# Patient Record
Sex: Female | Born: 1975
Health system: Southern US, Community
[De-identification: ages and names within clinical notes are randomized; demographics above are authoritative.]

## PROBLEM LIST (undated history)

## (undated) DIAGNOSIS — N201 Calculus of ureter: Secondary | ICD-10-CM

## (undated) DIAGNOSIS — R112 Nausea with vomiting, unspecified: Secondary | ICD-10-CM

## (undated) DIAGNOSIS — Z9889 Other specified postprocedural states: Secondary | ICD-10-CM

## (undated) DIAGNOSIS — G43909 Migraine, unspecified, not intractable, without status migrainosus: Secondary | ICD-10-CM

## (undated) DIAGNOSIS — N809 Endometriosis, unspecified: Secondary | ICD-10-CM

## (undated) DIAGNOSIS — R350 Frequency of micturition: Secondary | ICD-10-CM

## (undated) DIAGNOSIS — Z8619 Personal history of other infectious and parasitic diseases: Secondary | ICD-10-CM

## (undated) DIAGNOSIS — R3915 Urgency of urination: Secondary | ICD-10-CM

## (undated) DIAGNOSIS — R030 Elevated blood-pressure reading, without diagnosis of hypertension: Secondary | ICD-10-CM

## (undated) HISTORY — DX: Endometriosis, unspecified: N80.9

## (undated) HISTORY — DX: Migraine, unspecified, not intractable, without status migrainosus: G43.909

## (undated) HISTORY — DX: Personal history of other infectious and parasitic diseases: Z86.19

---
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
## 2003-12-03 ENCOUNTER — Other Ambulatory Visit: Admission: RE | Admit: 2003-12-03 | Discharge: 2003-12-03 | Payer: Self-pay | Admitting: Obstetrics & Gynecology

## 2004-07-14 ENCOUNTER — Inpatient Hospital Stay (HOSPITAL_COMMUNITY): Admission: AD | Admit: 2004-07-14 | Discharge: 2004-07-16 | Payer: Self-pay | Admitting: Obstetrics & Gynecology

## 2005-08-05 ENCOUNTER — Emergency Department (HOSPITAL_COMMUNITY): Admission: EM | Admit: 2005-08-05 | Discharge: 2005-08-05 | Payer: Self-pay | Admitting: Family Medicine

## 2007-06-08 ENCOUNTER — Ambulatory Visit (HOSPITAL_COMMUNITY): Admission: RE | Admit: 2007-06-08 | Discharge: 2007-06-08 | Payer: Self-pay | Admitting: Obstetrics & Gynecology

## 2007-10-02 ENCOUNTER — Ambulatory Visit (HOSPITAL_COMMUNITY): Admission: RE | Admit: 2007-10-02 | Discharge: 2007-10-02 | Payer: Self-pay | Admitting: Obstetrics and Gynecology

## 2007-10-02 HISTORY — PX: OTHER SURGICAL HISTORY: SHX169

## 2008-12-28 ENCOUNTER — Inpatient Hospital Stay (HOSPITAL_COMMUNITY): Admission: AD | Admit: 2008-12-28 | Discharge: 2008-12-30 | Payer: Self-pay | Admitting: Obstetrics and Gynecology

## 2010-08-30 ENCOUNTER — Encounter: Payer: Self-pay | Admitting: Obstetrics and Gynecology

## 2010-11-17 LAB — CBC
MCHC: 34.9 g/dL (ref 30.0–36.0)
MCV: 95 fL (ref 78.0–100.0)
Platelets: 167 10*3/uL (ref 150–400)
RBC: 3 MIL/uL — ABNORMAL LOW (ref 3.87–5.11)
RDW: 14.9 % (ref 11.5–15.5)
WBC: 15.2 10*3/uL — ABNORMAL HIGH (ref 4.0–10.5)

## 2010-12-22 NOTE — Op Note (Signed)
[name], [name]                 ACCOUNT NO.:  [id number]   MEDICAL RECORD NO.:  [id number]          PATIENT TYPE:  AMB   LOCATION:  SDC                           FACILITY:  [facility]   PHYSICIAN:  Fermin Schwab, MD   DATE OF BIRTH:  01/17/1976   DATE OF PROCEDURE:  10/02/2007  DATE OF DISCHARGE:                               OPERATIVE REPORT   PREOPERATIVE DIAGNOSIS:  Dysmenorrhea, probable endometriosis, probable  pelvic adhesions, probable right hydrosalpinx.   POSTOPERATIVE DIAGNOSIS:  Stage II endometriosis of the ovaries,  posterior cul-de-sac, uterus, bowel and diaphragm; right fimbrial  phimosis, pelvic adhesions.   PROCEDURE:  Laparoscopy, lysis of adhesions and paralysis, ablation and  biopsy of peritoneal endometriotic lesions, right fimbrioplasty.   SURGEON:  Dr. Fermin Schwab   ANESTHESIA:  General endotracheal.   FINDINGS:  On exam under anesthesia, external genitalia, Bartholin's,  Skene's and  urethra were normal.  The vagina appeared normal.  The  cervix was grossly normal.  The corpus was retroverted and normal size  and somewhat restricted mobility.  The left uterosacral ligament felt  thickened as if it contained a nodule.  No adnexal masses were palpable.  Rectovaginal exam confirmed sparing of the rectal mucosa.  On  laparoscopy, there was unexpected finding of 50 to 60 mL of old  thickened blood in the posterior cul-de-sac as well as in the pericolic  gutters and in the subdiaphragmatic space.  Considering the patient had  started her period 7 days prior to this, we concluded that this was a  combination of bleeding from pelvic peritoneal lesions as well as an  increased amount of retrograde menstruation.   There were hemosiderin pigmented areas with loosely attached small blood  clots, scattered on the right diaphragm surfaces more than on the left  hemidiaphragm surface.  The appendix and ileum were free of  endometriosis.  The sigmoid colon was  adherent to the left infundibular  pelvic ligament and left pelvic sidewall as well as to the left ovary  and contained some superficial polypoid endometriosis lesions.  The  posterior cul-de-sac had started to be obliterated with the advancement  of the uterus to left uterosacral ligament whereas the right part of the  cul-de-sac was free of adhesions.  These adhesions were dense.   The anterior cul-de-sac and the fundus of the uterus were normal.  The  posterior uterine serosa contained bleb-like lesions which may have been  endometriosis.  There were also dark adhesions which also likely  represent endometriosis on the fundus.  The left ovary was matted to the  left pelvic sidewall with cohesive adhesions.  There were also 2 to 3 cm  of superficial pink adhesions that likely represented endometriosis.  The left ovary could be freed up, to be adherent only 1/3 of its surface  area after the adhesiolysis.  The left tube was normal and contained 5/5  fimbria.  It was patent to chromotubation, except there was a filmy  adhesion between the infundibulum of the tube and the sigmoid colon.  The right tube was free of adhesions and  appeared normal except there  was some narrowing of the fimbria even though the fimbria appeared  healthy, and a diverticulum/distal dilation of the tube was noted upon  chromotubation.  This was taken care of by dilating the infundibulum of  the tube intraoperatively.   The right ovary contained superficial pink adhesions, 1 to 2 cm in  aggregate, likely representing endometriosis.   The posterior cul-de-sac was red with abnormal capillaries bleeding  diffusely.  This may have been the source of the old blood found in the  pelvis.  This likely represents a superficial form of endometriosis  involving the entire posterior cul-de-sac.   Added together, the lesions of endometriosis and the lesions that were  deemed to be endometriosis gave a point score of 4 + 4 +  4 + 8 + 1  equals 21 according to American Society for [employer]roductive Medicine  Revised Classification of Endometriosis.  This would make it a Stage III  endometriosis for this patient.   DESCRIPTION OF PROCEDURE:  The patient was placed in the lithotomy  position.  One gm of Kefzol was given intravenously for prophylaxis.  General endotracheal anesthesia was started.  The patient was placed in  the lithotomy position.  The abdomen, vagina, and peritoneum were  prepped with Betadine.  The patient was draped in a sterile manner. A  Foley catheter was inserted into the bladder.  A ZUMI catheter was  placed into the uterus after it sounded to 7.5 cm.  The balloon of the  ZUMI catheter was inflated to 3 mL with saline.  This was used for  chromotubation as well as used for manipulation during laparoscopy.  Next a surgeon was re-gloved and an operative field was created on the  abdomen.  A preemptive anesthesia was given 0.25% Marcaine with 1:200000  epinephrine into the infraumbilical region, as well as for the secondary  ports.  A 5 mm vertical incision was made in the umbilicus.  A Veress  needle was inserted.  A 5 mm trocar was inserted, and video laparoscopy  was started.  Two 5 mm lower abdominal incisions were also made for  ancillary ports.  Above findings were noted.  The old blood was  aspirated from subphrenic space as well as from the cul-de-sac.  A  careful search was done to look for other sources of this blood.  Except  for the raw-appearing in the posterior cul-de-sac, no other source was  found.  The superficial peritoneal lesions were too diffuse to be  ablated.  For this reason, only the raw area on the posterior cul-de-sac  peritoneum was ablated using a needle electrode in cutting mode at 35  watts setting.  The same procedure was done for the superficial  adhesions on both ovaries.  Some of the dark raised areas on the uterine  fundus were also ablated.  The rest of the  lesions may have been  endometriosis involving the entire posterior uterine serosa were left  alone.  A biopsy was taken from the dark raised adhesion on the right  utero-ovarian ligament and was sent to pathology.  Next, the left tube  was lysed from the intestinal adhesion.  With careful dissection, about  2/3 of the adhesions involving the left ovary could be lysed by blunt  and sharp dissection.  The sigmoid colon was separated from its adhesion  to the infundibular pelvic ligament and the left pelvic side wall.  The  beginning of obliteration of the posterior cul-de-sac  on the left was  then lysed by sharp dissection, and the uterus could now have free  mobility.  Next, chromotubation was performed and bilateral patency was  noted.  Because of ballooning of the right distal tube despite patency,  an atraumatic grasper was inserted gently into the right tubal  infundibulum and its jaws were spread to dilate the opening of the right  tube.  Hemostasis was assured.  The pelvis was copiously irrigated with  warm lactated ringers and aspirated.  A 20 mL slurry of 1 sheet of  Seprafilm in lactated ringer was instilled onto the left ovarian surface  and posterior uterus and cul-de-sac as an adhesion barrier.  The  instruments were removed again, and the gas was allowed to come out.  The instrument count was correct.  The skin incisions were approximated  with Dermabond.  The estimated blood loss was less than 50 mL.  The  patient tolerated the procedure well and was transferred to the recovery  room in satisfactory condition.      Fermin Schwab, MD  Electronically Signed    TY/MEDQ  D:  10/03/2007  T:  10/03/2007  Job:  [id number]   cc:   Ilda Mori, M.D.  Fax: [phone]

## 2011-04-30 LAB — CBC
Hemoglobin: 13.4
MCV: 88.9
Platelets: 261
RBC: 4.32
RDW: 13.4
WBC: 6.2

## 2012-09-25 ENCOUNTER — Other Ambulatory Visit: Payer: Self-pay | Admitting: Surgery

## 2013-01-29 ENCOUNTER — Other Ambulatory Visit: Payer: Self-pay | Admitting: Obstetrics and Gynecology

## 2013-05-21 ENCOUNTER — Encounter (HOSPITAL_COMMUNITY): Payer: Self-pay | Admitting: Emergency Medicine

## 2013-05-21 ENCOUNTER — Emergency Department (HOSPITAL_COMMUNITY)
Admission: EM | Admit: 2013-05-21 | Discharge: 2013-05-21 | Disposition: A | Discharge status: Home / Self Care | Payer: BC Managed Care – PPO | Source: Home / Self Care | Attending: Emergency Medicine | Admitting: Emergency Medicine

## 2013-05-21 DIAGNOSIS — B349 Viral infection, unspecified: Secondary | ICD-10-CM

## 2013-05-21 DIAGNOSIS — B9789 Other viral agents as the cause of diseases classified elsewhere: Secondary | ICD-10-CM

## 2013-05-21 MED ORDER — ZOLMITRIPTAN 2.5 MG PO TABS: 2.5000 mg | ORAL_TABLET | ORAL | Status: DC | PRN

## 2013-05-21 MED ORDER — DICLOFENAC POTASSIUM 25 MG PO CAPS: ORAL_CAPSULE | ORAL | Status: DC

## 2013-05-21 NOTE — ED Provider Notes (Signed)
Chief Complaint:   Chief Complaint  Patient presents with  . Fever    History of Present Illness:   [name] is a-year-old female who has had a six-day history of low-grade fever of up to 100, chills, and myalgias. Her temperature is back down to normal and the myalgias have gone away, but she still has chills at nighttime. This has caused her migraine headaches to flareup. She's had these for years and they usually respond well to Zomig and diclofenac. The symptoms came on 2 days after a trip to [place] She has no known exposures there, but thinks she may have been bitten by mosquitoes there. She denies any nasal congestion, rhinorrhea, earache, sore throat, swollen glands, stiff neck, cough, abdominal pain, nausea, vomiting, diarrhea, urinary or GYN complaints.  Review of Systems:  Other than noted above, the patient denies any of the following symptoms. Systemic:  No chills, sweats, fatigue, myalgias, headache, or anorexia. Eye:  No redness, pain or drainage. ENT:  No earache, nasal congestion, rhinorrhea, sinus pressure, or sore throat. No adenopathy or stiff neck. Lungs:  No cough, sputum production, wheezing, shortness of breath.  Cardiovascular:  No chest pain, palpitations, or syncope. GI:  No nausea, vomiting, abdominal pain or diarrhea. GU:  No dysuria, frequency, or hematuria. Skin:  No rash or pruritis.  PMFSH:  Past medical history, family history, social history, meds, and allergies were reviewed. There is no history of recent foreign travel, animal exposure, suspicious ingestions or tick bite.  No new medications, vaccination, or stings.    Physical Exam:   Vital signs:  BP 149/98  Pulse 113  Temp(Src) 99.1 F (37.3 C) (Oral)  Resp 16  SpO2 99%  LMP 05/11/2013 General:  Alert, in no distress. Eye:  PERRL, full EOMs.  Lids and conjunctivas were normal. ENT:  TMs and canals were normal, without erythema or inflammation.  Nasal mucosa was clear and uncongested,  without drainage.  Mucous membranes were moist.  Pharynx was erythematous  without exudate or drainage.  There were no oral ulcerations or lesions. Neck:  Supple, no adenopathy, tenderness or mass. Thyroid was normal. Lungs:  No respiratory distress.  Lungs were clear to auscultation, without wheezes, rales or rhonchi.  Breath sounds were clear and equal bilaterally. Heart:  Regular rhythm, without gallops, murmers or rubs. Abdomen:  Soft, flat, and non-tender to palpation.  No hepatosplenomagaly or mass. Extremities:  No swelling, erythema, or joint pain to palpation. Skin:  Clear, warm, and dry, without rash or lesions.  Assessment:  The encounter diagnosis was Viral syndrome.  Differential diagnosis includes viral URI, influenza, or dengue fever.  Plan:   1.  Meds:  The following meds were prescribed:   Discharge Medication List as of 05/21/2013  9:25 AM    START taking these medications   Details  !! Diclofenac Potassium (ZIPSOR) 25 MG CAPS Take 1 every 8 hours as needed for migraine headache., Normal    !! ZOLMitriptan (ZOMIG) 2.5 MG tablet Take 1 tablet (2.5 mg total) by mouth as needed for migraine., Starting 05/21/2013, Until Discontinued, Normal     !! - Potential duplicate medications found. Please discuss with provider.      2.  Patient Education/Counseling:  The patient was given appropriate handouts, self care instructions, and instructed in symptomatic relief.  Suggested rest and fluids. May return to work on Wednesday.  3.  Follow up:  The patient was told to follow up if no better in 3 to  4 days, if becoming worse in any way, and given some red flag symptoms such as increasing fever or if she should become worse in any way which would prompt immediate return.  Follow up here as needed.     Reuben Likes, MD 05/21/13 1007

## 2013-05-21 NOTE — ED Notes (Addendum)
Pt  Reports    About  5  Days  Ago  She  Developed   Body   Aches     Low  Grade  Fever    Pt  Reports       Recently  Returned  From    [place]         She  denys  Any  sorethroat       Or  Any  Diarrhea

## 2014-02-18 ENCOUNTER — Other Ambulatory Visit: Payer: Self-pay | Admitting: Obstetrics and Gynecology

## 2014-02-18 LAB — HM PAP SMEAR: HM Pap smear: NORMAL

## 2014-02-19 LAB — CYTOLOGY - PAP

## 2014-06-28 DIAGNOSIS — J3489 Other specified disorders of nose and nasal sinuses: Secondary | ICD-10-CM | POA: Insufficient documentation

## 2014-09-09 HISTORY — PX: NASAL SEPTUM SURGERY: SHX37

## 2014-11-20 ENCOUNTER — Encounter: Payer: Self-pay | Admitting: Internal Medicine

## 2014-11-20 ENCOUNTER — Ambulatory Visit (INDEPENDENT_AMBULATORY_CARE_PROVIDER_SITE_OTHER): Payer: BLUE CROSS/BLUE SHIELD | Admitting: Internal Medicine

## 2014-11-20 VITALS — BP 135/86 | Temp 99.2°F | Ht 62.75 in | Wt 127.6 lb

## 2014-11-20 DIAGNOSIS — R519 Headache, unspecified: Secondary | ICD-10-CM

## 2014-11-20 DIAGNOSIS — Z23 Encounter for immunization: Secondary | ICD-10-CM

## 2014-11-20 DIAGNOSIS — R51 Headache: Secondary | ICD-10-CM

## 2014-11-20 DIAGNOSIS — R030 Elevated blood-pressure reading, without diagnosis of hypertension: Secondary | ICD-10-CM

## 2014-11-20 DIAGNOSIS — Z7189 Other specified counseling: Secondary | ICD-10-CM | POA: Diagnosis not present

## 2014-11-20 DIAGNOSIS — Z7689 Persons encountering health services in other specified circumstances: Secondary | ICD-10-CM

## 2014-11-20 DIAGNOSIS — IMO0001 Reserved for inherently not codable concepts without codable children: Secondary | ICD-10-CM

## 2014-11-20 NOTE — Patient Instructions (Signed)
Take blood pressure readings twice a day for 10 - 14 days and then periodically .To ensure below 140/90   .record   Return in a month   With monitor and  Readings    .  Get us copy of labs from [facility]  and derm to review . We will then add what is appropriate .    DASH Eating Plan DASH stands for "Dietary Approaches to Stop Hypertension." The DASH eating plan is a healthy eating plan that has been shown to reduce high blood pressure (hypertension). Additional health benefits may include reducing the risk of type 2 diabetes mellitus, heart disease, and stroke. The DASH eating plan may also help with weight loss. WHAT DO I NEED TO KNOW ABOUT THE DASH EATING PLAN? For the DASH eating plan, you will follow these general guidelines:  Choose foods with a percent daily value for sodium of less than 5% (as listed on the food label).  Use salt-free seasonings or herbs instead of table salt or sea salt.  Check with your health care provider or pharmacist before using salt substitutes.  Eat lower-sodium products, often labeled as "lower sodium" or "no salt added."  Eat fresh foods.  Eat more vegetables, fruits, and low-fat dairy products.  Choose whole grains. Look for the word "whole" as the first word in the ingredient list.  Choose fish and skinless chicken or [place] more often than red meat. Limit fish, poultry, and meat to 6 oz (170 g) each day.  Limit sweets, desserts, sugars, and sugary drinks.  Choose heart-healthy fats.  Limit cheese to 1 oz (28 g) per day.  Eat more home-cooked food and less restaurant, buffet, and fast food.  Limit fried foods.  Cook foods using methods other than frying.  Limit canned vegetables. If you do use them, rinse them well to decrease the sodium.  When eating at a restaurant, ask that your food be prepared with less salt, or no salt if possible. WHAT FOODS CAN I EAT? Seek help from a dietitian for individual calorie needs. Grains Whole grain or  whole wheat bread. Brown rice. Whole grain or whole wheat pasta. Quinoa, bulgur, and whole grain cereals. Low-sodium cereals. Corn or whole wheat flour tortillas. Whole grain cornbread. Whole grain crackers. Low-sodium crackers. Vegetables Fresh or frozen vegetables (raw, steamed, roasted, or grilled). Low-sodium or reduced-sodium tomato and vegetable juices. Low-sodium or reduced-sodium tomato sauce and paste. Low-sodium or reduced-sodium canned vegetables.  Fruits All fresh, canned (in natural juice), or frozen fruits. Meat and Other Protein Products Ground beef (85% or leaner), grass-fed beef, or beef trimmed of fat. Skinless chicken or [place]. Ground chicken or [place]. Pork trimmed of fat. All fish and seafood. Eggs. Dried beans, peas, or lentils. Unsalted nuts and seeds. Unsalted canned beans. Dairy Low-fat dairy products, such as skim or 1% milk, 2% or reduced-fat cheeses, low-fat ricotta or cottage cheese, or plain low-fat yogurt. Low-sodium or reduced-sodium cheeses. Fats and Oils Tub margarines without trans fats. Light or reduced-fat mayonnaise and salad dressings (reduced sodium). Avocado. Safflower, olive, or canola oils. Natural peanut or almond butter. Other Unsalted popcorn and pretzels. The items listed above may not be a complete list of recommended foods or beverages. Contact your dietitian for more options. WHAT FOODS ARE NOT RECOMMENDED? Grains White bread. White pasta. White rice. Refined cornbread. Bagels and croissants. Crackers that contain trans fat. Vegetables Creamed or fried vegetables. Vegetables in a cheese sauce. Regular canned vegetables. Regular canned tomato sauce and paste. Regular  tomato and vegetable juices. Fruits Dried fruits. Canned fruit in light or heavy syrup. Fruit juice. Meat and Other Protein Products Fatty cuts of meat. Ribs, chicken wings, bacon, sausage, bologna, salami, chitterlings, fatback, hot dogs, bratwurst, and packaged luncheon meats.  Salted nuts and seeds. Canned beans with salt. Dairy Whole or 2% milk, cream, half-and-half, and cream cheese. Whole-fat or sweetened yogurt. Full-fat cheeses or blue cheese. Nondairy creamers and whipped toppings. Processed cheese, cheese spreads, or cheese curds. Condiments Onion and garlic salt, seasoned salt, table salt, and sea salt. Canned and packaged gravies. Worcestershire sauce. Tartar sauce. Barbecue sauce. Teriyaki sauce. Soy sauce, including reduced sodium. Steak sauce. Fish sauce. Oyster sauce. Cocktail sauce. Horseradish. Ketchup and mustard. Meat flavorings and tenderizers. Bouillon cubes. Hot sauce. Tabasco sauce. Marinades. Taco seasonings. Relishes. Fats and Oils Butter, stick margarine, lard, shortening, ghee, and bacon fat. Coconut, palm kernel, or palm oils. Regular salad dressings. Other Pickles and olives. Salted popcorn and pretzels. The items listed above may not be a complete list of foods and beverages to avoid. Contact your dietitian for more information. WHERE CAN I FIND MORE INFORMATION? National Heart, Lung, and Blood Institute: [employer] Document Released: 07/15/2011 Document Revised: 12/10/2013 Document Reviewed: 05/30/2013 [facility] Patient Information 2015 [place], [place]. This information is not intended to replace advice given to you by your health care provider. Make sure you discuss any questions you have with your health care provider.

## 2014-11-20 NOTE — Progress Notes (Signed)
Pre visit review using our clinic review tool, if applicable. No additional management support is needed unless otherwise documented below in the visit note.  Chief Complaint  Patient presents with  . Establish Care    HPI: Patient  [name]  39 y.o. comes in today for New patient / Health Care visit  who is a married  Mother of 2 college graduate say at home with kids orig from [place]  .  She hasnt really had a PCP bu gyne dr Tenny Craw and has seen derm for  Hair thinning and in past mole removals  And mini clinic for other issues . She has had noted elevated bp readings with out dx of hypertension sometimes normal  High BP  readings. : Deviated septum had elevated bp when having anesthesia.  140/90  No meds for this  Apparently nasal issues  In retrospect caused sig pm sleep issues and headache aggravation  As she feels much better since the surgery   Monitor BP checking can causes some anxiety .   Even with monitor . At home  And raise readings  Parents  On meds for ht. No ht of pregnancy .  Migraines  2 years ago .  Nasal surgery helped the  Headaches . Frequency .  Periods : iud for 6 years  Migraine med .    [facility] Dr.  [facility]     Health Maintenance  Topic Date Due  . HIV Screening  11/10/2015 (Originally 10/17/1990)  . INFLUENZA VACCINE  03/10/2015  . PAP SMEAR  02/18/2017  . TETANUS/TDAP  11/19/2024   Health Maintenance Review LIFESTYLE:  Exercise:   Walking and yoga  No limitations  Tobacco/ETS:no Alcohol: per day social Sugar beverages:no Sleep: better after nose surgery  Drug use: no  PAP:utd  ROS:  GEN/ HEENT: No fever, significant weight changes sweats vision problems hearing changes, CV/ PULM; No chest pain shortness of breath cough, syncope,edema  change in exercise tolerance. GI /GU: No adominal pain, vomiting, change in bowel habits. No blood in the stool. No significant GU symptoms. SKIN/HEME: ,no acute skin rashes suspicious lesions or  bleeding. No lymphadenopathy, nodules, masses.  NEURO/ PSYCH:  No neurologic signs such as weakness numbness. No depression anxiety. IMM/ Allergy: No unusual infections.  Allergy .   REST of 12 system review negative except as per HPI   Past Medical History  Diagnosis Date  . Migraine   . Hx of varicella   . Endometriosis     surgery 2009    Past Surgical History  Procedure Laterality Date  . Tonsillectomy  85  . Nasal septum surgery  feb 16     [facility] Dr Berna Spare  . Ablation on endometriosis  2009    Family History  Problem Relation Age of Onset  . Hypertension Mother   . Hypertension Father   . Breast cancer Mother     age 29   . Heart failure Mother     after breast cancer rx has icdalso     History   Social History  . Marital Status: Married    Spouse Name: N/A  . Number of Children: N/A  . Years of Education: N/A   Social History Main Topics  . Smoking status: Never Smoker   . Smokeless tobacco: Never Used  . Alcohol Use: 0.0 oz/week    0 Standard drinks or equivalent per week  . Drug Use: Not on file  . Sexual Activity:  Partners: Male   Other Topics Concern  . None   Social History Narrative   6-8 hours of sleep per night   Does not work outside home   Married for 12 years with 2 children   Children are 2yrs and 5 yrs.   No pets   etoh social    Hot yoga  And walking    g2 p2   Born raised  [place]   College degree [employer] [place]    Outpatient Encounter Prescriptions as of 11/20/2014  Medication Sig  . Biotin 5000 MCG CAPS Take by mouth.  . Diclofenac Potassium (ZIPSOR) 25 MG CAPS Take 1 every 8 hours as needed for migraine headache.  . IRON PO Take by mouth.  . levonorgestrel (MIRENA) 20 MCG/24HR IUD by Intrauterine route.  . Omega-3 Fatty Acids (FISH OIL) 1200 MG CAPS Take by mouth.  Marland Kitchen ZOLMitriptan (ZOMIG) 2.5 MG tablet Take 1 tablet (2.5 mg total) by mouth as needed for migraine.  . [DISCONTINUED] Diclofenac  Potassium (ZIPSOR PO) Take by mouth.  . [DISCONTINUED] ZOLMitriptan (ZOMIG PO) Take by mouth.    EXAM:  BP 135/86 mmHg  Temp(Src) 99.2 F (37.3 C) (Oral)  Ht 5' 2.75" (1.594 m)  Wt 127 lb 9.6 oz (57.879 kg)  BMI 22.78 kg/m2  Body mass index is 22.78 kg/(m^2). Repeat 135/86 right 130/90 let  Physical Exam: Vital signs reviewed ZOX:WRUE is a well-developed well-nourished alert cooperative    who appearsr stated age in no acute distress.  HEENT: normocephalic atraumatic , Eyes: PERRL EOM's full, conjunctiva clear, Nares: paten,t no deformity discharge or tenderness., Ears: no deformity EAC's clear TMs with normal landmarks. Mouth: clear OP, no lesions, edema.  Moist mucous membranes. Dentition in adequate repair. NECK: supple without masses, thyromegaly or bruits. CHEST/PULM:  Clear to auscultation breath sounds equal no wheeze , rales or rhonchi. No chest wall deformities or tenderness. CV: PMI is nondisplaced, S1 S2 no gallops, murmurs, rubs. Peripheral pulses are full without delay. ABDOMEN: Bowel sounds normal nontender  No guard or rebound, no hepato splenomegal no CVA tenderness.  . Extremtities:  No clubbing cyanosis or edema, no acute joint swelling or redness no focal atrophy NEURO:  Oriented x3, cranial nerves 3-12 appear to be intact, no obvious focal weakness,gait within normal limits no abnormal reflexes or asymmetrical SKIN: No acute rashes normal turgor, color, no bruising or petechiae. PSYCH: Oriented, good eye contact, no obvious depression anxiety, cognition and judgment appear normal. LN: no cervical l adenopathy  Lab Results  Component Value Date   WBC 15.2* 12/29/2008   HGB 9.9 DELTA CHECK NOTED* 12/29/2008   HCT 28.4* 12/29/2008   PLT 150 12/29/2008    ASSESSMENT AND PLAN:  Discussed the following assessment and plan:  Elevated BP - poss ht fam hx white oat effect disc dx lsi and meds if needed may be getting better after nose surgery sleeps  better  Recurrent headache - migraine  better since had nasal surgery   Encounter to establish care with new doctor  Need for Tdap vaccination - Plan: Tdap vaccine greater than or equal to 7yo IM Plan on labs  Depending on results and tests done by her gyne and derm recenetly  Patient Care Team: Madelin Headings, MD as PCP - General (Internal Medicine) Patient Instructions  Take blood pressure readings twice a day for 10 - 14 days and then periodically .To ensure below 140/90   .record   Return in a month  With monitor and  Readings    .  Get [place] copy of labs from [facility]  and derm to review . We will then add what is appropriate .    DASH Eating Plan DASH stands for "Dietary Approaches to Stop Hypertension." The DASH eating plan is a healthy eating plan that has been shown to reduce high blood pressure (hypertension). Additional health benefits may include reducing the risk of type 2 diabetes mellitus, heart disease, and stroke. The DASH eating plan may also help with weight loss. WHAT DO I NEED TO KNOW ABOUT THE DASH EATING PLAN? For the DASH eating plan, you will follow these general guidelines:  Choose foods with a percent daily value for sodium of less than 5% (as listed on the food label).  Use salt-free seasonings or herbs instead of table salt or sea salt.  Check with your health care provider or pharmacist before using salt substitutes.  Eat lower-sodium products, often labeled as "lower sodium" or "no salt added."  Eat fresh foods.  Eat more vegetables, fruits, and low-fat dairy products.  Choose whole grains. Look for the word "whole" as the first word in the ingredient list.  Choose fish and skinless chicken or [place] more often than red meat. Limit fish, poultry, and meat to 6 oz (170 g) each day.  Limit sweets, desserts, sugars, and sugary drinks.  Choose heart-healthy fats.  Limit cheese to 1 oz (28 g) per day.  Eat more home-cooked food and less restaurant,  buffet, and fast food.  Limit fried foods.  Cook foods using methods other than frying.  Limit canned vegetables. If you do use them, rinse them well to decrease the sodium.  When eating at a restaurant, ask that your food be prepared with less salt, or no salt if possible. WHAT FOODS CAN I EAT? Seek help from a dietitian for individual calorie needs. Grains Whole grain or whole wheat bread. Brown rice. Whole grain or whole wheat pasta. Quinoa, bulgur, and whole grain cereals. Low-sodium cereals. Corn or whole wheat flour tortillas. Whole grain cornbread. Whole grain crackers. Low-sodium crackers. Vegetables Fresh or frozen vegetables (raw, steamed, roasted, or grilled). Low-sodium or reduced-sodium tomato and vegetable juices. Low-sodium or reduced-sodium tomato sauce and paste. Low-sodium or reduced-sodium canned vegetables.  Fruits All fresh, canned (in natural juice), or frozen fruits. Meat and Other Protein Products Ground beef (85% or leaner), grass-fed beef, or beef trimmed of fat. Skinless chicken or [place]. Ground chicken or [place]. Pork trimmed of fat. All fish and seafood. Eggs. Dried beans, peas, or lentils. Unsalted nuts and seeds. Unsalted canned beans. Dairy Low-fat dairy products, such as skim or 1% milk, 2% or reduced-fat cheeses, low-fat ricotta or cottage cheese, or plain low-fat yogurt. Low-sodium or reduced-sodium cheeses. Fats and Oils Tub margarines without trans fats. Light or reduced-fat mayonnaise and salad dressings (reduced sodium). Avocado. Safflower, olive, or canola oils. Natural peanut or almond butter. Other Unsalted popcorn and pretzels. The items listed above may not be a complete list of recommended foods or beverages. Contact your dietitian for more options. WHAT FOODS ARE NOT RECOMMENDED? Grains White bread. White pasta. White rice. Refined cornbread. Bagels and croissants. Crackers that contain trans fat. Vegetables Creamed or fried vegetables.  Vegetables in a cheese sauce. Regular canned vegetables. Regular canned tomato sauce and paste. Regular tomato and vegetable juices. Fruits Dried fruits. Canned fruit in light or heavy syrup. Fruit juice. Meat and Other Protein Products Fatty cuts of meat. Ribs, chicken wings, bacon,  sausage, bologna, salami, chitterlings, fatback, hot dogs, bratwurst, and packaged luncheon meats. Salted nuts and seeds. Canned beans with salt. Dairy Whole or 2% milk, cream, half-and-half, and cream cheese. Whole-fat or sweetened yogurt. Full-fat cheeses or blue cheese. Nondairy creamers and whipped toppings. Processed cheese, cheese spreads, or cheese curds. Condiments Onion and garlic salt, seasoned salt, table salt, and sea salt. Canned and packaged gravies. Worcestershire sauce. Tartar sauce. Barbecue sauce. Teriyaki sauce. Soy sauce, including reduced sodium. Steak sauce. Fish sauce. Oyster sauce. Cocktail sauce. Horseradish. Ketchup and mustard. Meat flavorings and tenderizers. Bouillon cubes. Hot sauce. Tabasco sauce. Marinades. Taco seasonings. Relishes. Fats and Oils Butter, stick margarine, lard, shortening, ghee, and bacon fat. Coconut, palm kernel, or palm oils. Regular salad dressings. Other Pickles and olives. Salted popcorn and pretzels. The items listed above may not be a complete list of foods and beverages to avoid. Contact your dietitian for more information. WHERE CAN I FIND MORE INFORMATION? National Heart, Lung, and Blood Institute: CablePromo.itwww.nhlbi.nih.gov/health/health-topics/topics/dash/ Document Released: 07/15/2011 Document Revised: 12/10/2013 Document Reviewed: 05/30/2013 [facility] Patient Information 2015 [place]ExitCare, MarylandLLC. This information is not intended to replace advice given to you by your health care provider. Make sure you discuss any questions you have with your health care provider.      Neta MendsWanda K. Kassidy Dockendorf M.D.   BP Readings from Last 3 Encounters:  11/20/14 135/86  05/21/13 149/98

## 2014-11-24 ENCOUNTER — Encounter: Payer: Self-pay | Admitting: Internal Medicine

## 2014-11-24 DIAGNOSIS — IMO0001 Reserved for inherently not codable concepts without codable children: Secondary | ICD-10-CM | POA: Insufficient documentation

## 2014-11-24 DIAGNOSIS — R519 Headache, unspecified: Secondary | ICD-10-CM | POA: Insufficient documentation

## 2014-11-24 DIAGNOSIS — R030 Elevated blood-pressure reading, without diagnosis of hypertension: Principal | ICD-10-CM

## 2014-11-24 DIAGNOSIS — R51 Headache: Secondary | ICD-10-CM

## 2014-12-20 ENCOUNTER — Ambulatory Visit: Payer: BLUE CROSS/BLUE SHIELD | Admitting: Internal Medicine

## 2015-01-09 ENCOUNTER — Encounter: Payer: BLUE CROSS/BLUE SHIELD | Admitting: Internal Medicine

## 2015-01-09 NOTE — Progress Notes (Signed)
Document opened and reviewed for OV but appt  canceled same day .  

## 2015-01-30 ENCOUNTER — Ambulatory Visit (INDEPENDENT_AMBULATORY_CARE_PROVIDER_SITE_OTHER): Payer: BLUE CROSS/BLUE SHIELD | Admitting: Internal Medicine

## 2015-01-30 ENCOUNTER — Encounter: Payer: Self-pay | Admitting: Internal Medicine

## 2015-01-30 VITALS — BP 126/84 | Temp 98.5°F | Ht 62.75 in | Wt 123.0 lb

## 2015-01-30 DIAGNOSIS — IMO0001 Reserved for inherently not codable concepts without codable children: Secondary | ICD-10-CM

## 2015-01-30 DIAGNOSIS — R51 Headache: Secondary | ICD-10-CM | POA: Diagnosis not present

## 2015-01-30 DIAGNOSIS — R030 Elevated blood-pressure reading, without diagnosis of hypertension: Secondary | ICD-10-CM

## 2015-01-30 DIAGNOSIS — R519 Headache, unspecified: Secondary | ICD-10-CM

## 2015-01-30 NOTE — Progress Notes (Signed)
Pre visit review using our clinic review tool, if applicable. No additional management support is needed unless otherwise documented below in the visit note.  Chief Complaint  Patient presents with  . Follow-up    HPI: 43 T Ayub 39 y.o. comesinf for fu elevaetd bp readings  Canceled last  2 appt once cause of HA    Since then ok jsut bought a monitor  .  Had visit with ps for botox and reasign slightly elevated. Migraine less frequent trigger wine or caffiene. Doing better.  ROS: See pertinent positives and negatives per HPI.  Past Medical History  Diagnosis Date  . Migraine   . Hx of varicella   . Endometriosis     surgery 2009    Family History  Problem Relation Age of Onset  . Hypertension Mother   . Hypertension Father   . Breast cancer Mother     age 21   . Heart failure Mother     after breast cancer rx has icdalso     History   Social History  . Marital Status: Married    Spouse Name: N/A  . Number of Children: N/A  . Years of Education: N/A   Social History Main Topics  . Smoking status: Never Smoker   . Smokeless tobacco: Never Used  . Alcohol Use: 0.0 oz/week    0 Standard drinks or equivalent per week  . Drug Use: Not on file  . Sexual Activity:    Partners: Male   Other Topics Concern  . None   Social History Narrative   6-8 hours of sleep per night   Does not work outside home   Married for 12 years with 2 children   Children are 63yrs and 5 yrs.   No pets   etoh social    Hot yoga  And walking    g2 p2   Born raised  [place]   College degree [employer] [place]    Outpatient Prescriptions Prior to Visit  Medication Sig Dispense Refill  . Biotin 5000 MCG CAPS Take by mouth.    . Diclofenac Potassium (ZIPSOR) 25 MG CAPS Take 1 every 8 hours as needed for migraine headache. 30 capsule 3  . IRON PO Take by mouth.    . levonorgestrel (MIRENA) 20 MCG/24HR IUD by Intrauterine route.    . Omega-3 Fatty Acids  (FISH OIL) 1200 MG CAPS Take by mouth.    Marland Kitchen ZOLMitriptan (ZOMIG) 2.5 MG tablet Take 1 tablet (2.5 mg total) by mouth as needed for migraine. 10 tablet 3   No facility-administered medications prior to visit.     EXAM:  BP 126/84 mmHg  Temp(Src) 98.5 F (36.9 C) (Oral)  Ht 5' 2.75" (1.594 m)  Wt 123 lb (55.792 kg)  BMI 21.96 kg/m2  Body mass index is 21.96 kg/(m^2). GENERAL: vitals reviewed and listed above, alert, oriented, appears well hydrated and in no acute distress HEENT: aPSYCH: pleasant and cooperative, no obvious depression or anxiety BP Readings from Last 3 Encounters:  01/30/15 126/84  11/20/14 135/86  05/21/13 149/98    Machine 130/93  Office cuff  Later 126/84  Left arm .   ASSESSMENT AND PLAN:  Discussed the following assessment and plan:  Elevated BP - better  disc monitoring and sign up for my chart   Recurrent headache - better  follow   -Patient advised to return or notify health care team  if symptoms worsen ,persist or new concerns arise.  Patient Instructions  Your blood pressure monitor is good enough  Check readings at least 3 days per month  In a row as discussed  If  At goal then continue healthy life styl  Send in readings my chart  With confirmation and concerns.  Track headaches  As you are doing.   Wellness visit  Age 69 with labs .     Neta Mends. Bayler Gehrig M.D.

## 2015-01-30 NOTE — Patient Instructions (Signed)
Your blood pressure monitor is good enough  Check readings at least 3 days per month  In a row as discussed  If  At goal then continue healthy life styl  Send in readings my chart  With confirmation and concerns.  Track headaches  As you are doing.   Wellness visit  Age 39 with labs .

## 2015-02-11 ENCOUNTER — Emergency Department (HOSPITAL_COMMUNITY)
Admission: EM | Admit: 2015-02-11 | Discharge: 2015-02-12 | Disposition: A | Discharge status: Home / Self Care | Payer: BLUE CROSS/BLUE SHIELD | Attending: Emergency Medicine | Admitting: Emergency Medicine

## 2015-02-11 ENCOUNTER — Encounter (HOSPITAL_COMMUNITY): Payer: Self-pay | Admitting: Emergency Medicine

## 2015-02-11 DIAGNOSIS — N938 Other specified abnormal uterine and vaginal bleeding: Secondary | ICD-10-CM | POA: Diagnosis not present

## 2015-02-11 DIAGNOSIS — N201 Calculus of ureter: Secondary | ICD-10-CM | POA: Diagnosis not present

## 2015-02-11 DIAGNOSIS — N23 Unspecified renal colic: Secondary | ICD-10-CM | POA: Diagnosis not present

## 2015-02-11 DIAGNOSIS — R102 Pelvic and perineal pain: Secondary | ICD-10-CM

## 2015-02-11 DIAGNOSIS — Z3202 Encounter for pregnancy test, result negative: Secondary | ICD-10-CM | POA: Insufficient documentation

## 2015-02-11 DIAGNOSIS — G43909 Migraine, unspecified, not intractable, without status migrainosus: Secondary | ICD-10-CM | POA: Diagnosis not present

## 2015-02-11 DIAGNOSIS — N8320 Unspecified ovarian cysts: Secondary | ICD-10-CM | POA: Insufficient documentation

## 2015-02-11 DIAGNOSIS — Z8742 Personal history of other diseases of the female genital tract: Secondary | ICD-10-CM | POA: Diagnosis not present

## 2015-02-11 DIAGNOSIS — R1032 Left lower quadrant pain: Secondary | ICD-10-CM

## 2015-02-11 DIAGNOSIS — Z8619 Personal history of other infectious and parasitic diseases: Secondary | ICD-10-CM | POA: Diagnosis not present

## 2015-02-11 DIAGNOSIS — E876 Hypokalemia: Secondary | ICD-10-CM | POA: Insufficient documentation

## 2015-02-11 DIAGNOSIS — N83201 Unspecified ovarian cyst, right side: Secondary | ICD-10-CM

## 2015-02-11 LAB — COMPREHENSIVE METABOLIC PANEL
ALBUMIN: 3.9 g/dL (ref 3.5–5.0)
ALK PHOS: 60 U/L (ref 38–126)
ALT: 17 U/L (ref 14–54)
AST: 19 U/L (ref 15–41)
Anion gap: 7 (ref 5–15)
BUN: 14 mg/dL (ref 6–20)
CHLORIDE: 106 mmol/L (ref 101–111)
CO2: 27 mmol/L (ref 22–32)
Calcium: 9.3 mg/dL (ref 8.9–10.3)
Creatinine, Ser: 0.93 mg/dL (ref 0.44–1.00)
GFR calc Af Amer: >60 mL/min (ref >60)
GFR calc non Af Amer: >60 mL/min (ref >60)
Glucose, Bld: 104 mg/dL — ABNORMAL HIGH (ref 65–99)
POTASSIUM: 3 mmol/L — AB (ref 3.5–5.1)
SODIUM: 140 mmol/L (ref 135–145)
TOTAL PROTEIN: 6.9 g/dL (ref 6.5–8.1)
Total Bilirubin: 0.5 mg/dL (ref 0.3–1.2)

## 2015-02-11 LAB — CBC WITH DIFFERENTIAL/PLATELET
Basophils Absolute: 0 10*3/uL (ref 0.0–0.1)
Basophils Relative: 0 % (ref 0–1)
EOS PCT: 11 % — AB (ref 0–5)
Eosinophils Absolute: 0.8 10*3/uL — ABNORMAL HIGH (ref 0.0–0.7)
HEMATOCRIT: 39.9 % (ref 36.0–46.0)
Hemoglobin: 13.4 g/dL (ref 12.0–15.0)
LYMPHS ABS: 1.7 10*3/uL (ref 0.7–4.0)
LYMPHS PCT: 25 % (ref 12–46)
MCH: 30.7 pg (ref 26.0–34.0)
MCHC: 33.6 g/dL (ref 30.0–36.0)
MCV: 91.3 fL (ref 78.0–100.0)
MONO ABS: 0.4 10*3/uL (ref 0.1–1.0)
MONOS PCT: 5 % (ref 3–12)
NEUTROS ABS: 4 10*3/uL (ref 1.7–7.7)
Neutrophils Relative %: 59 % (ref 43–77)
Platelets: 194 10*3/uL (ref 150–400)
RBC: 4.37 MIL/uL (ref 3.87–5.11)
RDW: 13.8 % (ref 11.5–15.5)
WBC: 6.8 10*3/uL (ref 4.0–10.5)

## 2015-02-11 LAB — WET PREP, GENITAL
Clue Cells Wet Prep HPF POC: NONE SEEN
Trich, Wet Prep: NONE SEEN
Yeast Wet Prep HPF POC: NONE SEEN

## 2015-02-11 LAB — LIPASE, BLOOD: LIPASE: 33 U/L (ref 22–51)

## 2015-02-11 LAB — PREGNANCY, URINE: PREG TEST UR: NEGATIVE

## 2015-02-11 MED ORDER — IOHEXOL 300 MG/ML  SOLN
25.0000 mL | Freq: Once | INTRAMUSCULAR | Status: AC | PRN
  Administered 2015-02-11: 25 mL via ORAL

## 2015-02-11 NOTE — ED Notes (Signed)
Pt from home for eval of LLQ abd pain that started yesterday, pt states she took some gas medicine that helped relieve the pain some but woke up at 2130 with intense pain. Pt denies any vaginal odor or discharge, pt also denies any dysuria at this time. States she has IUD in place and is on menstrual which she normally does not have. nad noted. Pt denies any n/v/d/ or fevers.

## 2015-02-11 NOTE — ED Notes (Signed)
PA at bedside.

## 2015-02-12 ENCOUNTER — Telehealth: Payer: Self-pay | Admitting: Internal Medicine

## 2015-02-12 ENCOUNTER — Encounter (HOSPITAL_COMMUNITY): Payer: Self-pay | Admitting: Radiology

## 2015-02-12 ENCOUNTER — Emergency Department (HOSPITAL_COMMUNITY): Payer: BLUE CROSS/BLUE SHIELD

## 2015-02-12 LAB — URINALYSIS, ROUTINE W REFLEX MICROSCOPIC
BILIRUBIN URINE: NEGATIVE
Glucose, UA: NEGATIVE mg/dL
Ketones, ur: NEGATIVE mg/dL
Nitrite: NEGATIVE
Protein, ur: NEGATIVE mg/dL
Specific Gravity, Urine: 1.017 (ref 1.005–1.030)
UROBILINOGEN UA: 0.2 mg/dL (ref 0.0–1.0)
pH: 6 (ref 5.0–8.0)

## 2015-02-12 LAB — GC/CHLAMYDIA PROBE AMP (~~LOC~~) NOT AT ARMC
CHLAMYDIA, DNA PROBE: NEGATIVE
Neisseria Gonorrhea: NEGATIVE

## 2015-02-12 LAB — URINE MICROSCOPIC-ADD ON

## 2015-02-12 MED ORDER — IBUPROFEN 800 MG PO TABS
800.0000 mg | ORAL_TABLET | Freq: Once | ORAL | Status: AC
  Administered 2015-02-12: 800 mg via ORAL
  Filled 2015-02-12: qty 1

## 2015-02-12 MED ORDER — IOHEXOL 300 MG/ML  SOLN
100.0000 mL | Freq: Once | INTRAMUSCULAR | Status: AC | PRN
  Administered 2015-02-12: 100 mL via INTRAVENOUS

## 2015-02-12 MED ORDER — HYDROCODONE-ACETAMINOPHEN 5-325 MG PO TABS: 1.0000 | ORAL_TABLET | Freq: Four times a day (QID) | ORAL | Status: DC | PRN

## 2015-02-12 MED ORDER — POTASSIUM CHLORIDE CRYS ER 20 MEQ PO TBCR
40.0000 meq | EXTENDED_RELEASE_TABLET | Freq: Once | ORAL | Status: AC
  Administered 2015-02-12: 40 meq via ORAL
  Filled 2015-02-12: qty 2

## 2015-02-12 MED ORDER — IBUPROFEN 800 MG PO TABS: 800.0000 mg | ORAL_TABLET | Freq: Three times a day (TID) | ORAL | Status: DC

## 2015-02-12 NOTE — Telephone Encounter (Signed)
Pt went to [place] yesterday and was dx with kidney stones and cyst on ovary. Pt would like to see dr Fabian Sharppanosh to see . Can I create 30 min slot?

## 2015-02-12 NOTE — Discharge Instructions (Signed)
1. Medications: ibuprofen, vicodin, usual home medications 2. Treatment: rest, drink plenty of fluids, advance diet slowly 3. Follow Up: Please followup with your primary doctor in 2 days for discussion of your diagnoses and further evaluation after today's visit; if you do not have a primary care doctor use the resource guide provided to find one; Please return to the ER for persistent vomiting, high fevers or worsening symptoms   Ureteral Colic (Kidney Stones) Ureteral colic is the result of a condition when kidney stones form inside the kidney. Once kidney stones are formed they may move into the tube that connects the kidney with the bladder (ureter). If this occurs, this condition may cause pain (colic) in the ureter.  CAUSES  Pain is caused by stone movement in the ureter and the obstruction caused by the stone. SYMPTOMS  The pain comes and goes as the ureter contracts around the stone. The pain is usually intense, sharp, and stabbing in character. The location of the pain may move as the stone moves through the ureter. When the stone is near the kidney the pain is usually located in the back and radiates to the belly (abdomen). When the stone is ready to pass into the bladder the pain is often located in the lower abdomen on the side the stone is located. At this location, the symptoms may mimic those of a urinary tract infection with urinary frequency. Once the stone is located here it often passes into the bladder and the pain disappears completely. TREATMENT   Your caregiver will provide you with medicine for pain relief.  You may require specialized follow-up X-rays.  The absence of pain does not always mean that the stone has passed. It may have just stopped moving. If the urine remains completely obstructed, it can cause loss of kidney function or even complete destruction of the involved kidney. It is your responsibility and in your interest that X-rays and follow-ups as suggested by  your caregiver are completed. Relief of pain without passage of the stone can be associated with severe damage to the kidney, including loss of kidney function on that side.  If your stone does not pass on its own, additional measures may be taken by your caregiver to ensure its removal. HOME CARE INSTRUCTIONS   Increase your fluid intake. Water is the preferred fluid since juices containing vitamin C may acidify the urine making it less likely for certain stones (uric acid stones) to pass.  Strain all urine. A strainer will be provided. Keep all particulate matter or stones for your caregiver to inspect.  Take your pain medicine as directed.  Make a follow-up appointment with your caregiver as directed.  Remember that the goal is passage of your stone. The absence of pain does not mean the stone is gone. Follow your caregiver's instructions.  Only take over-the-counter or prescription medicines for pain, discomfort, or fever as directed by your caregiver. SEEK MEDICAL CARE IF:   Pain cannot be controlled with the prescribed medicine.  You have a fever.  Pain continues for longer than your caregiver advises it should.  There is a change in the pain, and you develop chest discomfort or constant abdominal pain.  You feel faint or pass out. MAKE SURE YOU:   Understand these instructions.  Will watch your condition.  Will get help right away if you are not doing well or get worse. Document Released: 05/05/2005 Document Revised: 11/20/2012 Document Reviewed: 01/20/2011 [facility]Exit[facility] Patient Information 2015 [place]ExitCare, MarylandLLC. This information  is not intended to replace advice given to you by your health care provider. Make sure you discuss any questions you have with your health care provider.

## 2015-02-12 NOTE — Telephone Encounter (Signed)
Ok

## 2015-02-12 NOTE — Telephone Encounter (Signed)
Pt has been sch for 02-14-15

## 2015-02-12 NOTE — ED Notes (Signed)
Patient transported to CT 

## 2015-02-12 NOTE — ED Provider Notes (Signed)
CSN: [id number]     Arrival date & time 02/11/15  2212 History   First MD Initiated Contact with Patient 02/11/15 2234     Chief Complaint  Patient presents with  . Abdominal Pain     (Consider location/radiation/quality/duration/timing/severity/associated sxs/prior Treatment) The history is provided by the patient and medical records. No language interpreter was used.     [name] is a 39 y.o. female  with a hx of a headache, endometriosis with IUD in place presents to the Emergency Department complaining of gradual, persistent, progressively worsening left lower quadrant abdominal pain onset 2 days ago and acutely worsening tonight. Patient reports that her pain prior to arrival was 10/10 but has subsided to a 10/10 without any intervention. She reports that she has been menstruating for the last 2 days which coincided with the onset of her pain. She denies a vaginal discharge or odor. She also denies dysuria, hematuria, frequency or urgency.  She denies a history of diverticulitis, bloody stools or diarrhea. She does report normal bowel movement today. Nothing makes her pain better or worse. Patient denies fever, chills, headache, neck pain chest pain, breath, dysuria, hematuria, vaginal discharge, syncope, dizziness.  Patient reports she is married and in a monogamous relationship with a female partner.   Past Medical History  Diagnosis Date  . Migraine   . Hx of varicella   . Endometriosis     surgery 2009   Past Surgical History  Procedure Laterality Date  . Tonsillectomy  85  . Nasal septum surgery  feb 16     [facility] Berna Spare  . Ablation on endometriosis  2009   Family History  Problem Relation Age of Onset  . Hypertension Mother   . Hypertension Father   . Breast cancer Mother     age 34   . Heart failure Mother     after breast cancer rx has icdalso    History  Substance Use Topics  . Smoking status: Never Smoker   . Smokeless tobacco: Never Used  . Alcohol Use:  0.0 oz/week    0 Standard drinks or equivalent per week   OB History    Gravida Para Term Preterm AB TAB SAB Ectopic Multiple Living   Review of Systems  Constitutional: Negative for fever, diaphoresis, appetite change, fatigue and unexpected weight change.  HENT: Negative for mouth sores.   Eyes: Negative for visual disturbance.  Respiratory: Negative for cough, chest tightness, shortness of breath and wheezing.   Cardiovascular: Negative for chest pain.  Gastrointestinal: Positive for abdominal pain (LLQ). Negative for nausea, vomiting, diarrhea and constipation.  Endocrine: Negative for polydipsia, polyphagia and polyuria.  Genitourinary: Positive for vaginal bleeding. Negative for dysuria, urgency, frequency and hematuria.  Musculoskeletal: Negative for back pain and neck stiffness.  Skin: Negative for rash.  Allergic/Immunologic: Negative for immunocompromised state.  Neurological: Negative for syncope, light-headedness and headaches.  Hematological: Does not bruise/bleed easily.  Psychiatric/Behavioral: Negative for sleep disturbance. The patient is not nervous/anxious.       Allergies  Review of patient's allergies indicates no known allergies.  Home Medications   Prior to Admission medications   Medication Sig Start Date End Date Taking? Authorizing Provider  Biotin 5000 MCG CAPS Take by mouth.    Historical Provider, MD  Diclofenac Potassium (ZIPSOR) 25 MG CAPS Take 1 every 8 hours as needed for migraine headache. 05/21/13   Dineen Kid  Lorenz Coaster, MD  HYDROcodone-acetaminophen (NORCO/VICODIN) 5-325 MG per tablet Take 1-2 tablets by mouth every 6 (six) hours as needed for moderate pain or severe pain. 02/12/15   Josslin Sanjuan, PA-C  ibuprofen (ADVIL,MOTRIN) 800 MG tablet Take 1 tablet (800 mg total) by mouth 3 (three) times daily. 02/12/15   Darrill Vreeland, PA-C  IRON PO Take by mouth.    Historical Provider, MD  levonorgestrel (MIRENA) 20 MCG/24HR IUD  by Intrauterine route.    Historical Provider, MD  Omega-3 Fatty Acids (FISH OIL) 1200 MG CAPS Take by mouth.    Historical Provider, MD  ZOLMitriptan (ZOMIG) 2.5 MG tablet Take 1 tablet (2.5 mg total) by mouth as needed for migraine. 05/21/13   Reuben Likes, MD   BP 124/72 mmHg  Pulse 67  Temp(Src) 98.9 F (37.2 C) (Oral)  Resp 20  Ht 5\' 4"  (1.626 m)  Wt 127 lb (57.607 kg)  BMI 21.79 kg/m2  SpO2 100% Physical Exam  Constitutional: She appears well-developed and well-nourished. No distress.  HENT:  Head: Normocephalic and atraumatic.  Eyes: Conjunctivae are normal.  Neck: Normal range of motion.  Cardiovascular: Normal rate, regular rhythm, normal heart sounds and intact distal pulses.   No murmur heard. Pulmonary/Chest: Effort normal and breath sounds normal. No respiratory distress. She has no wheezes.  Abdominal: Soft. Bowel sounds are normal. There is tenderness in the left lower quadrant. There is no rebound, no guarding and no CVA tenderness. Hernia confirmed negative in the right inguinal area and confirmed negative in the left inguinal area.  Left lower quadrant abdominal tenderness without guarding or rebound No CVA tenderness  Genitourinary: Uterus normal. No labial fusion. There is no rash, tenderness or lesion on the right labia. There is no rash, tenderness or lesion on the left labia. Uterus is not deviated, not enlarged, not fixed and not tender. Cervix exhibits no motion tenderness, no discharge and no friability. Right adnexum displays no mass, no tenderness and no fullness. Left adnexum displays no mass, no tenderness and no fullness. There is bleeding (small amount of dark blood in the vaginal vault) in the vagina. No erythema or tenderness in the vagina. No foreign body around the vagina. No signs of injury around the vagina. No vaginal discharge found.  IUD strings visible No cervical motion tenderness or adnexal tenderness No masses  Musculoskeletal: Normal range  of motion. She exhibits no edema.  Lymphadenopathy:       Right: No inguinal adenopathy present.       Left: No inguinal adenopathy present.  Neurological: She is alert.  Skin: Skin is warm and dry. She is not diaphoretic. No erythema.  Psychiatric: She has a normal mood and affect.  Nursing note and vitals reviewed.   ED Course  Procedures (including critical care time) Labs Review Labs Reviewed  WET PREP, GENITAL - Abnormal; Notable for the following:    WBC, Wet Prep HPF POC TOO NUMEROUS TO COUNT (*)    All other components within normal limits  CBC WITH DIFFERENTIAL/PLATELET - Abnormal; Notable for the following:    Eosinophils Relative 11 (*)    Eosinophils Absolute 0.8 (*)    All other components within normal limits  COMPREHENSIVE METABOLIC PANEL - Abnormal; Notable for the following:    Potassium 3.0 (*)    Glucose, Bld 104 (*)    All other components within normal limits  URINALYSIS, ROUTINE W REFLEX MICROSCOPIC (NOT AT [facility]) - Abnormal; Notable for the following:    APPearance CLOUDY (*)  Hgb urine dipstick LARGE (*)    Leukocytes, UA TRACE (*)    All other components within normal limits  LIPASE, BLOOD  PREGNANCY, URINE  URINE MICROSCOPIC-ADD ON  GC/CHLAMYDIA PROBE AMP ([place]) NOT AT [facility]    Imaging Review Ct Abdomen Pelvis W Contrast  02/12/2015   CLINICAL DATA:  Left lower quadrant pain starting yesterday.  EXAM: CT ABDOMEN AND PELVIS WITH CONTRAST  TECHNIQUE: Multidetector CT imaging of the abdomen and pelvis was performed using the standard protocol following bolus administration of intravenous contrast.  CONTRAST:  100mL OMNIPAQUE IOHEXOL 300 MG/ML  SOLN  COMPARISON:  None.  FINDINGS: BODY WALL: No contributory findings.  LOWER CHEST: No contributory findings.  ABDOMEN/PELVIS:  Liver: No focal abnormality.  Biliary: No evidence of biliary obstruction or stone.  Pancreas: Unremarkable.  Spleen: Unremarkable.  Adrenals: Unremarkable.  Kidneys and ureters: 6  x 4 mm stone at the left UPJ with mild urothelial thickening but no hydronephrosis. No additional urolithiasis.  Bladder: Unremarkable.  Reproductive: IUD which is normally positioned. Simple appearing 7 cm cyst from the right ovary. The left ovary is indistinguishable from the sigmoid colon but there is no associated inflammatory changes. This could be related to the history of endometriosis. This gives the appearance of a thickened sigmoid colon on axial imaging, but no thickening seen on reformats.  Bowel: No obstruction. No appendicitis.  Retroperitoneum: No mass or adenopathy.  Peritoneum: No ascites or pneumoperitoneum.  Vascular: No acute abnormality.  OSSEOUS: No acute abnormalities.  IMPRESSION: 1. 6 x 4 mm nonobstructing stone at the left UPJ. 2. 7 cm right ovarian cyst. Recommend pelvic ultrasound follow-up in 6-12 weeks.   Electronically Signed   By: Marnee SpringJonathon  Watts M.D.   On: 02/12/2015 01:05     EKG Interpretation None      MDM   Final diagnoses:  LLQ abdominal pain  Pelvic pain in female  Right ovarian cyst  Renal colic on left side  Left ureteral stone   Mozella T Muma presents with left lower quadrant abdominal pain worsening over the last several days with acute worsening tonight. No vaginal discharge noted on exam. Small amount of blood in the vaginal vault. Patient without adnexal or cervical motion tenderness. Will obtain CT scan and lab work.  12:55 AM Procedures negative. Urinalysis without evidence of urinary tract infection.  Too numerous to count white blood cells.  No leukocytosis.  Hypokalemia noted and repleted here in the emergency department.  CT scan pending.  1:39 AM CT scan with 6 x 4 mm nonobstructing stone at the left UPJ without hydronephrosis. IUD is normally positioned. 7 cm right ovarian cyst.  Discussed findings with patient including incidental finding of her ovarian cyst. Recommend close follow-up with her OB/GYN. Patient's pain controlled with  ibuprofen at this time. Will be discharged home with ibuprofen and Vicodin. Should return precautions given including fevers, vomiting, intractable pain.  No evidence of associated urinary tract infection.  BP 124/72 mmHg  Pulse 67  Temp(Src) 98.9 F (37.2 C) (Oral)  Resp 20  Ht 5\' 4"  (1.626 m)  Wt 127 lb (57.607 kg)  BMI 21.79 kg/m2  SpO2 100%   Dierdre ForthHannah Kauan Kloosterman, PA-C 02/12/15 0140  Mancel BaleElliott Wentz, MD 02/12/15 1000

## 2015-02-14 ENCOUNTER — Encounter: Payer: Self-pay | Admitting: Internal Medicine

## 2015-02-14 ENCOUNTER — Ambulatory Visit (INDEPENDENT_AMBULATORY_CARE_PROVIDER_SITE_OTHER): Payer: BLUE CROSS/BLUE SHIELD | Admitting: Internal Medicine

## 2015-02-14 VITALS — BP 122/82 | Temp 98.6°F | Wt 132.4 lb

## 2015-02-14 DIAGNOSIS — N200 Calculus of kidney: Secondary | ICD-10-CM | POA: Diagnosis not present

## 2015-02-14 DIAGNOSIS — N83201 Unspecified ovarian cyst, right side: Secondary | ICD-10-CM

## 2015-02-14 DIAGNOSIS — E876 Hypokalemia: Secondary | ICD-10-CM | POA: Diagnosis not present

## 2015-02-14 DIAGNOSIS — N8320 Unspecified ovarian cysts: Secondary | ICD-10-CM | POA: Diagnosis not present

## 2015-02-14 LAB — BASIC METABOLIC PANEL
BUN: 17 mg/dL (ref 6–23)
CHLORIDE: 106 meq/L (ref 96–112)
CO2: 29 mEq/L (ref 19–32)
Calcium: 9.6 mg/dL (ref 8.4–10.5)
Creatinine, Ser: 0.82 mg/dL (ref 0.40–1.20)
GFR: 82.35 mL/min (ref >60.00)
Glucose, Bld: 65 mg/dL — ABNORMAL LOW (ref 70–99)
POTASSIUM: 4.1 meq/L (ref 3.5–5.1)
Sodium: 141 mEq/L (ref 135–145)

## 2015-02-14 LAB — POCT URINALYSIS DIPSTICK
BILIRUBIN UA: NEGATIVE
Glucose, UA: NEGATIVE
Ketones, UA: NEGATIVE
LEUKOCYTES UA: NEGATIVE
NITRITE UA: NEGATIVE
PH UA: 5.5
Protein, UA: NEGATIVE
Spec Grav, UA: 1.025
Urobilinogen, UA: 0.2

## 2015-02-14 LAB — MAGNESIUM: Magnesium: 2 mg/dL (ref 1.5–2.5)

## 2015-02-14 NOTE — Patient Instructions (Signed)
If can  Strain urine to check  Stone if passes then we can .analyze this . If ongoing problem we can get urology to see you.  Recheck potassium and magnesium today tomake sure ok .  If all ok then stay hydrated  . If not passing stone then urology may also need to see you.  Agree with GYNE follow up.

## 2015-02-14 NOTE — Progress Notes (Signed)
Pre visit review using our clinic review tool, if applicable. No additional management support is needed unless otherwise documented below in the visit note.  Chief Complaint  Patient presents with  . Post ED Follow UP    HPI: Patient come in for follow up from ED visit presented with abd pain large right oavarian cyust and left renal colic upj area   Dx by ct scan  UA tntc wbc  Pt has iud   Plan was hydration.  Small should be passing since that time she's not taking any pain medicine feels a lot better.Ontheleftside but doesn'tfee llikeitisactive.There i s nohematuriaandshedoesn'trememberpassingastone. She was also told that she had an ovarian cyst on the right. She states that she canceled her feel pressure she has a follow-up visit with her OB/GYN in early August who will then ultrasound the area. She is aware of complications symptoms that she should seek care for. There is a low potassium in the ED 3.0 and she was given pills of potassium which she didn't take there is no vomiting diarrhea or diuretic use. They gave her no explanation for this and she had no cramps.  Father just dx with stone. She has no personal history of same. ED note assessment. Final diagnoses:  LLQ abdominal pain  Pelvic pain in female  Right ovarian cyst  Renal colic on left side  Left ureteral stone   Sloka T Giovanelli presents with left lower quadrant abdominal pain worsening over the last several days with acute worsening tonight. No vaginal discharge noted on exam. Small amount of blood in the vaginal vault. Patient without adnexal or cervical motion tenderness. Will obtain CT scan and lab work.  12:55 AM Procedures negative. Urinalysis without evidence of urinary tract infection. Too numerous to count white blood cells. No leukocytosis. Hypokalemia noted and repleted here in the emergency department. CT scan pending.  1:39 AM CT scan with 6 x 4 mm nonobstructing stone at the left UPJ without  hydronephrosis. IUD is normally positioned. 7 cm right ovarian cyst. Discussed findings with patient including incidental finding of her ovarian cyst. Recommend close follow-up with her OB/GYN. Patient's pain controlled with ibuprofen at this time. Will be discharged home with ibuprofen and Vicodin. Should return precautions given including fevers, vomiting, intractable pain. No evidence of associated urinary tract infection.  BP 124/72 mmHg  Pulse 67  Temp(Src) 98.9 F (37.2 C) (Oral)  Resp 20  Ht  (1.626 m)  Wt 127 lb (57.607 kg)  BMI 21.79 kg/m2  SpO2 100%   Dierdre Forth, PA-C 02/12/15 0140  Mancel Bale, MD 02/12/15 1000          ROS: See pertinent positives and negatives per HPI. Currently no fever cardiopulmonary UTI symptoms.  Past Medical History  Diagnosis Date  . Migraine   . Hx of varicella   . Endometriosis     surgery 2009    Family History  Problem Relation Age of Onset  . Hypertension Mother   . Hypertension Father   . Breast cancer Mother     age 7   . Heart failure Mother     after breast cancer rx has icdalso     History   Social History  . Marital Status: Married    Spouse Name: N/A  . Number of Children: N/A  . Years of Education: N/A   Social History Main Topics  . Smoking status: Never Smoker   . Smokeless tobacco: Never Used  . Alcohol Use: 0.0  oz/week    0 Standard drinks or equivalent per week  . Drug Use: Not on file  . Sexual Activity:    Partners: Male   Other Topics Concern  . None   Social History Narrative   6-8 hours of sleep per night   Does not work outside home   Married for 12 years with 2 children   Children are 2879yrs and 5 yrs.   No pets   etoh social    Hot yoga  And walking    g2 p2   Born raised  [place]  heritage [place]   College degree [employer]    Outpatient Prescriptions Prior to Visit  Medication Sig Dispense Refill  . Biotin 5000 MCG CAPS Take by mouth.      . Diclofenac Potassium (ZIPSOR) 25 MG CAPS Take 1 every 8 hours as needed for migraine headache. 30 capsule 3  . IRON PO Take by mouth.    . levonorgestrel (MIRENA) 20 MCG/24HR IUD by Intrauterine route.    . Omega-3 Fatty Acids (FISH OIL) 1200 MG CAPS Take by mouth.    Marland Kitchen. ZOLMitriptan (ZOMIG) 2.5 MG tablet Take 1 tablet (2.5 mg total) by mouth as needed for migraine. 10 tablet 3  . ibuprofen (ADVIL,MOTRIN) 800 MG tablet Take 1 tablet (800 mg total) by mouth 3 (three) times daily. (Patient not taking: Reported on 02/14/2015) 21 tablet 0  . HYDROcodone-acetaminophen (NORCO/VICODIN) 5-325 MG per tablet Take 1-2 tablets by mouth every 6 (six) hours as needed for moderate pain or severe pain. 15 tablet 0   No facility-administered medications prior to visit.     EXAM:  BP 122/82 mmHg  Temp(Src) 98.6 F (37 C) (Oral)  Wt 132 lb 6.4 oz (60.056 kg)  Body mass index is 22.72 kg/(m^2).  GENERAL: vitals reviewed and listed above, alert, oriented, appears well hydrated and in no acute distress HEENT: atraumatic, conjunctiva  clear, no obvious abnormalities on inspection of external nose and ears  NECK: no obvious masses on inspection palpation  LUNGS: clear to auscultation bilaterally, no wheezes, rales or rhonchi, good air movement CV: HRRR, no clubbing cyanosis or  peripheral edema nl cap refill  Abdomen soft without again a megaly guarding or rebound no obvious masses felt. MS: moves all extremities without noticeable focal  abnormality PSYCH: pleasant and cooperative, no obvious depression or anxiety   ASSESSMENT AND PLAN:  Discussed the following assessment and plan:  Renal stone  left by ct no obst - ? if in bladder now no sx strainer given for stone analysis if passes  expectant mangment  stay hydrated  options discussed  - Plan: POCT urinalysis dipstick, Basic metabolic panel, Magnesium  Hypokalemia - unkonw cause ? if could be post obstructive sx  no sx now.  - Plan: POCT urinalysis  dipstick, Basic metabolic panel, Magnesium  Cyst of right ovary - large simple to see  her gyne in august and fu . recheck for sx .  -P uirnating normally now .   Stone could be in bladder she will strain her urine get back with us with symptoms at this point we'll hold off on urologic specialty evaluation .  Patient Instructions  If can  Strain urine to check  Stone if passes then we can .analyze this . If ongoing problem we can get urology to see you.  Recheck potassium and magnesium today tomake sure ok .  If all ok then stay hydrated  . If not passing stone  then urology may also need to see you.  Agree with GYNE follow up.    Neta Mends. Joshawn Crissman M.D.

## 2015-03-25 ENCOUNTER — Other Ambulatory Visit: Payer: Self-pay | Admitting: Obstetrics and Gynecology

## 2015-03-26 LAB — CYTOLOGY - PAP

## 2015-03-26 NOTE — Progress Notes (Signed)
Quick Note:  Tell patient PAP is normal. HPV high risk is negative ______ 

## 2015-03-27 ENCOUNTER — Encounter: Payer: Self-pay | Admitting: Family Medicine

## 2015-03-28 ENCOUNTER — Encounter: Payer: Self-pay | Admitting: Family Medicine

## 2015-04-11 ENCOUNTER — Telehealth: Payer: Self-pay | Admitting: Internal Medicine

## 2015-04-11 MED ORDER — ZOLMITRIPTAN 2.5 MG PO TABS: 2.5000 mg | ORAL_TABLET | ORAL | Status: DC | PRN

## 2015-04-11 NOTE — Telephone Encounter (Signed)
Ok to refill x 3  This is taking over med from a previous provider.

## 2015-04-11 NOTE — Telephone Encounter (Signed)
Sent to the pharmacy by e-scribe. 

## 2015-04-11 NOTE — Telephone Encounter (Signed)
Pt request refill of the following :ZOLMitriptan (ZOMIG) 2.5 MG tablet   Phamacy: [facility]

## 2015-08-22 ENCOUNTER — Encounter: Payer: Self-pay | Admitting: Internal Medicine

## 2015-08-22 ENCOUNTER — Ambulatory Visit (INDEPENDENT_AMBULATORY_CARE_PROVIDER_SITE_OTHER): Payer: BLUE CROSS/BLUE SHIELD | Admitting: Internal Medicine

## 2015-08-22 VITALS — BP 142/100 | Temp 98.5°F | Wt 123.8 lb

## 2015-08-22 DIAGNOSIS — L608 Other nail disorders: Secondary | ICD-10-CM | POA: Diagnosis not present

## 2015-08-22 DIAGNOSIS — R030 Elevated blood-pressure reading, without diagnosis of hypertension: Secondary | ICD-10-CM | POA: Diagnosis not present

## 2015-08-22 DIAGNOSIS — IMO0001 Reserved for inherently not codable concepts without codable children: Secondary | ICD-10-CM

## 2015-08-22 NOTE — Progress Notes (Signed)
Pre visit review using our clinic review tool, if applicable. No additional management support is needed unless otherwise documented below in the visit note.  Chief Complaint  Patient presents with  . Nail Problem    Rt Great Toe.  X6 months    HPI: Patient [name]  comes in today for SDA for  new problem evaluation. Noted 6 months ago dark spot medical left great toenail without obvious trauma or pain . continuing and no change or growing out . ask to check  Uses polish in summer not recently  Does run exercise but no known nail trauma .  bp 130 + at home working on bp  Up after exercise . Trying to dec salt in diet  fam hx   Taking biotin  Fish oil    Had nose surgery in recent past  ROS: See pertinent positives and negatives per HPI.  Past Medical History  Diagnosis Date  . Migraine   . Hx of varicella   . Endometriosis     surgery 2009    Family History  Problem Relation Age of Onset  . Hypertension Mother   . Hypertension Father   . Breast cancer Mother     age 40   . Heart failure Mother     after breast cancer rx has icdalso     Social History   Social History  . Marital Status: Married    Spouse Name: N/A  . Number of Children: N/A  . Years of Education: N/A   Social History Main Topics  . Smoking status: Never Smoker   . Smokeless tobacco: Never Used  . Alcohol Use: 0.0 oz/week    0 Standard drinks or equivalent per week  . Drug Use: Not on file  . Sexual Activity:    Partners: Male   Other Topics Concern  . Not on file   Social History Narrative   6-8 hours of sleep per night   Does not work outside home   Married for 12 years with 2 children   Children are 6030yrs and 5 yrs.   No pets   etoh social    Hot yoga  And walking    g2 p2   Born raised  [place] heritage [place]   College degree [employer]    Outpatient Prescriptions Prior to Visit  Medication Sig Dispense Refill  . Biotin 5000 MCG CAPS Take by mouth.    .  Diclofenac Potassium (ZIPSOR) 25 MG CAPS Take 1 every 8 hours as needed for migraine headache. 30 capsule 3  . ibuprofen (ADVIL,MOTRIN) 800 MG tablet Take 1 tablet (800 mg total) by mouth 3 (three) times daily. 21 tablet 0  . IRON PO Take by mouth.    . levonorgestrel (MIRENA) 20 MCG/24HR IUD by Intrauterine route.    . Omega-3 Fatty Acids (FISH OIL) 1200 MG CAPS Take by mouth.    Marland Kitchen. ZOLMitriptan (ZOMIG) 2.5 MG tablet Take 1 tablet (2.5 mg total) by mouth as needed for migraine. 10 tablet 2   No facility-administered medications prior to visit.     EXAM:  BP 142/100 mmHg  Temp(Src) 98.5 F (36.9 C) (Oral)  Wt 123 lb 12.8 oz (56.155 kg)  Body mass index is 21.24 kg/(m^2).  GENERAL: vitals reviewed and listed above, alert, oriented, appears well hydrated and in no acute distress HEENT: atraumatic, conjunctiva  clear, no obvious abnormalities on inspection of external nose and earsMS: moves all extremities without noticeable focal  Abnormality Left great toe with medial  Dark brown ish and darker spot proximally   Not at base and skin has no discoloration  PSYCH: pleasant and cooperative, no obvious depression or anxiety  ASSESSMENT AND PLAN:  Discussed the following assessment and plan:  Discoloration of nail - no hx of trauma and no progression change for 6 mos advise  derm opinion  Elevated BP - 137/70 home pre ht readings bring monitor to next visit  -Patient advised to return or notify health care team  if symptoms worsen ,persist or new concerns arise. In the interim   Patient Instructions  Take  A picture . Avoid trauam  See your dermatologist  For opinion Nails  Grow out extermely slowly.      Neta Mends. Annison Birchard M.D.

## 2015-08-22 NOTE — Patient Instructions (Addendum)
Take  A picture . Avoid trauam  See your dermatologist  For opinion Nails  Grow out extermely slowly.

## 2015-08-28 ENCOUNTER — Ambulatory Visit: Payer: BLUE CROSS/BLUE SHIELD | Admitting: Internal Medicine

## 2015-10-06 ENCOUNTER — Other Ambulatory Visit (INDEPENDENT_AMBULATORY_CARE_PROVIDER_SITE_OTHER): Payer: BLUE CROSS/BLUE SHIELD

## 2015-10-06 DIAGNOSIS — Z0000 Encounter for general adult medical examination without abnormal findings: Secondary | ICD-10-CM

## 2015-10-06 LAB — HEPATIC FUNCTION PANEL
ALK PHOS: 51 U/L (ref 39–117)
ALT: 13 U/L (ref 0–35)
AST: 17 U/L (ref 0–37)
Albumin: 4.4 g/dL (ref 3.5–5.2)
BILIRUBIN DIRECT: 0.1 mg/dL (ref 0.0–0.3)
BILIRUBIN TOTAL: 0.7 mg/dL (ref 0.2–1.2)
TOTAL PROTEIN: 7.3 g/dL (ref 6.0–8.3)

## 2015-10-06 LAB — LIPID PANEL
Cholesterol: 144 mg/dL (ref 0–200)
HDL: 49.8 mg/dL (ref >39.00)
LDL CALC: 80 mg/dL (ref 0–99)
NONHDL: 94.28
TRIGLYCERIDES: 72 mg/dL (ref 0.0–149.0)
Total CHOL/HDL Ratio: 3
VLDL: 14.4 mg/dL (ref 0.0–40.0)

## 2015-10-06 LAB — CBC WITH DIFFERENTIAL/PLATELET
Basophils Absolute: 0 10*3/uL (ref 0.0–0.1)
Basophils Relative: 0.4 % (ref 0.0–3.0)
EOS ABS: 0.6 10*3/uL (ref 0.0–0.7)
Eosinophils Relative: 15.2 % — ABNORMAL HIGH (ref 0.0–5.0)
HEMATOCRIT: 38.3 % (ref 36.0–46.0)
HEMOGLOBIN: 12.9 g/dL (ref 12.0–15.0)
Lymphocytes Relative: 23 % (ref 12.0–46.0)
Lymphs Abs: 1 10*3/uL (ref 0.7–4.0)
MCHC: 33.8 g/dL (ref 30.0–36.0)
MCV: 91.5 fl (ref 78.0–100.0)
MONO ABS: 0.4 10*3/uL (ref 0.1–1.0)
Monocytes Relative: 10.4 % (ref 3.0–12.0)
Neutro Abs: 2.1 10*3/uL (ref 1.4–7.7)
Neutrophils Relative %: 51 % (ref 43.0–77.0)
Platelets: 201 10*3/uL (ref 150.0–400.0)
RBC: 4.18 Mil/uL (ref 3.87–5.11)
RDW: 13.4 % (ref 11.5–15.5)
WBC: 4.2 10*3/uL (ref 4.0–10.5)

## 2015-10-06 LAB — TSH: TSH: 1.47 u[IU]/mL (ref 0.35–4.50)

## 2015-10-06 LAB — BASIC METABOLIC PANEL
BUN: 14 mg/dL (ref 6–23)
CO2: 25 mEq/L (ref 19–32)
CREATININE: 0.79 mg/dL (ref 0.40–1.20)
Calcium: 9.4 mg/dL (ref 8.4–10.5)
Chloride: 107 mEq/L (ref 96–112)
GFR: 85.68 mL/min (ref >60.00)
GLUCOSE: 85 mg/dL (ref 70–99)
Potassium: 3.9 mEq/L (ref 3.5–5.1)
SODIUM: 140 meq/L (ref 135–145)

## 2015-10-13 ENCOUNTER — Encounter: Payer: BLUE CROSS/BLUE SHIELD | Admitting: Internal Medicine

## 2015-10-13 NOTE — Progress Notes (Signed)
Document opened and reviewed for OV PVbut appt  canceled same day . Came in 30 minu late for appt

## 2015-10-16 NOTE — Patient Instructions (Addendum)
Continue lifestyle intervention healthy eating and exercise . You will be contacted about urology referral for ongoing  Symptoms  And renal stone  Uncertain cause of the elevated  Eosinophils   May be clinically insignificant  Repeat cbc and esosiophil count  Yearly visit   Otherwise   Health Maintenance, Female Adopting a healthy lifestyle and getting preventive care can go a long way to promote health and wellness. Talk with your health care provider about what schedule of regular examinations is right for you. This is a good chance for you to check in with your provider about disease prevention and staying healthy. In between checkups, there are plenty of things you can do on your own. Experts have done a lot of research about which lifestyle changes and preventive measures are most likely to keep you healthy. Ask your health care provider for more information. WEIGHT AND DIET  Eat a healthy diet  Be sure to include plenty of vegetables, fruits, low-fat dairy products, and lean protein.  Do not eat a lot of foods high in solid fats, added sugars, or salt.  Get regular exercise. This is one of the most important things you can do for your health.  Most adults should exercise for at least 150 minutes each week. The exercise should increase your heart rate and make you sweat (moderate-intensity exercise).  Most adults should also do strengthening exercises at least twice a week. This is in addition to the moderate-intensity exercise.  Maintain a healthy weight  Body mass index (BMI) is a measurement that can be used to identify possible weight problems. It estimates body fat based on height and weight. Your health care provider can help determine your BMI and help you achieve or maintain a healthy weight.  For females 40 years of age and older:   A BMI below 18.5 is considered underweight.  A BMI of 18.5 to 24.9 is normal.  A BMI of 25 to 29.9 is considered overweight.  A BMI  of 30 and above is considered obese.  Watch levels of cholesterol and blood lipids  You should start having your blood tested for lipids and cholesterol at 40 years of age, then have this test every 5 years.  You may need to have your cholesterol levels checked more often if:  Your lipid or cholesterol levels are high.  You are older than 40 years of age.  You are at high risk for heart disease.  CANCER SCREENING   Lung Cancer  Lung cancer screening is recommended for adults 40-40 years old who are at high risk for lung cancer because of a history of smoking.  A yearly low-dose CT scan of the lungs is recommended for people who:  Currently smoke.  Have quit within the past 15 years.  Have at least a 30-pack-year history of smoking. A pack year is smoking an average of one pack of cigarettes a day for 1 year.  Yearly screening should continue until it has been 15 years since you quit.  Yearly screening should stop if you develop a health problem that would prevent you from having lung cancer treatment.  Breast Cancer  Practice breast self-awareness. This means understanding how your breasts normally appear and feel.  It also means doing regular breast self-exams. Let your health care provider know about any changes, no matter how small.  If you are in your 40s or 40s, you should have a clinical breast exam (CBE) by a health care provider every  1-3 years as part of a regular health exam.  If you are 40 or older, have a CBE every year. Also consider having a breast X-ray (mammogram) every year.  If you have a family history of breast cancer, talk to your health care provider about genetic screening.  If you are at high risk for breast cancer, talk to your health care provider about having an MRI and a mammogram every year.  Breast cancer gene (BRCA) assessment is recommended for women who have family members with BRCA-related cancers. BRCA-related cancers  include:  Breast.  Ovarian.  Tubal.  Peritoneal cancers.  Results of the assessment will determine the need for genetic counseling and BRCA1 and BRCA2 testing. Cervical Cancer Your health care provider may recommend that you be screened regularly for cancer of the pelvic organs (ovaries, uterus, and vagina). This screening involves a pelvic examination, including checking for microscopic changes to the surface of your cervix (Pap test). You may be encouraged to have this screening done every 3 years, beginning at age 40.  For women ages 40-65, health care providers may recommend pelvic exams and Pap testing every 3 years, or they may recommend the Pap and pelvic exam, combined with testing for human papilloma virus (HPV), every 5 years. Some types of HPV increase your risk of cervical cancer. Testing for HPV may also be done on women of any age with unclear Pap test results.  Other health care providers may not recommend any screening for nonpregnant women who are considered low risk for pelvic cancer and who do not have symptoms. Ask your health care provider if a screening pelvic exam is right for you.  If you have had past treatment for cervical cancer or a condition that could lead to cancer, you need Pap tests and screening for cancer for at least 20 years after your treatment. If Pap tests have been discontinued, your risk factors (such as having a new sexual partner) need to be reassessed to determine if screening should resume. Some women have medical problems that increase the chance of getting cervical cancer. In these cases, your health care provider may recommend more frequent screening and Pap tests. Colorectal Cancer  This type of cancer can be detected and often prevented.  Routine colorectal cancer screening usually begins at 40 years of age and continues through 40 years of age.  Your health care provider may recommend screening at an earlier age if you have risk factors for  colon cancer.  Your health care provider may also recommend using home test kits to check for hidden blood in the stool.  A small camera at the end of a tube can be used to examine your colon directly (sigmoidoscopy or colonoscopy). This is done to check for the earliest forms of colorectal cancer.  Routine screening usually begins at age 40.  Direct examination of the colon should be repeated every 5-10 years through 40 years of age. However, you may need to be screened more often if early forms of precancerous polyps or small growths are found. Skin Cancer  Check your skin from head to toe regularly.  Tell your health care provider about any new moles or changes in moles, especially if there is a change in a mole's shape or color.  Also tell your health care provider if you have a mole that is larger than the size of a pencil eraser.  Always use sunscreen. Apply sunscreen liberally and repeatedly throughout the day.  Protect yourself by wearing  long sleeves, pants, a wide-brimmed hat, and sunglasses whenever you are outside. HEART DISEASE, DIABETES, AND HIGH BLOOD PRESSURE   High blood pressure causes heart disease and increases the risk of stroke. High blood pressure is more likely to develop in:  People who have blood pressure in the high end of the normal range (130-139/85-89 mm Hg).  People who are overweight or obese.  People who are African American.  If you are 44-71 years of age, have your blood pressure checked every 3-5 years. If you are 42 years of age or older, have your blood pressure checked every year. You should have your blood pressure measured twice--once when you are at a hospital or clinic, and once when you are not at a hospital or clinic. Record the average of the two measurements. To check your blood pressure when you are not at a hospital or clinic, you can use:  An automated blood pressure machine at a pharmacy.  A home blood pressure monitor.  If you  are between 60 years and 20 years old, ask your health care provider if you should take aspirin to prevent strokes.  Have regular diabetes screenings. This involves taking a blood sample to check your fasting blood sugar level.  If you are at a normal weight and have a low risk for diabetes, have this test once every three years after 40 years of age.  If you are overweight and have a high risk for diabetes, consider being tested at a younger age or more often. PREVENTING INFECTION  Hepatitis B  If you have a higher risk for hepatitis B, you should be screened for this virus. You are considered at high risk for hepatitis B if:  You were born in a country where hepatitis B is common. Ask your health care provider which countries are considered high risk.  Your parents were born in a high-risk country, and you have not been immunized against hepatitis B (hepatitis B vaccine).  You have HIV or AIDS.  You use needles to inject street drugs.  You live with someone who has hepatitis B.  You have had sex with someone who has hepatitis B.  You get hemodialysis treatment.  You take certain medicines for conditions, including cancer, organ transplantation, and autoimmune conditions. Hepatitis C  Blood testing is recommended for:  Everyone born from [birth year] through 1965.  Anyone with known risk factors for hepatitis C. Sexually transmitted infections (STIs)  You should be screened for sexually transmitted infections (STIs) including gonorrhea and chlamydia if:  You are sexually active and are younger than 40 years of age.  You are older than 40 years of age and your health care provider tells you that you are at risk for this type of infection.  Your sexual activity has changed since you were last screened and you are at an increased risk for chlamydia or gonorrhea. Ask your health care provider if you are at risk.  If you do not have HIV, but are at risk, it may be recommended that you  take a prescription medicine daily to prevent HIV infection. This is called pre-exposure prophylaxis (PrEP). You are considered at risk if:  You are sexually active and do not regularly use condoms or know the HIV status of your partner(s).  You take drugs by injection.  You are sexually active with a partner who has HIV. Talk with your health care provider about whether you are at high risk of being infected with HIV. If you  choose to begin PrEP, you should first be tested for HIV. You should then be tested every 3 months for as long as you are taking PrEP.  PREGNANCY   If you are premenopausal and you may become pregnant, ask your health care provider about preconception counseling.  If you may become pregnant, take 400 to 800 micrograms (mcg) of folic acid every day.  If you want to prevent pregnancy, talk to your health care provider about birth control (contraception). OSTEOPOROSIS AND MENOPAUSE   Osteoporosis is a disease in which the bones lose minerals and strength with aging. This can result in serious bone fractures. Your risk for osteoporosis can be identified using a bone density scan.  If you are 65 years of age or older, or if you are at risk for osteoporosis and fractures, ask your health care provider if you should be screened.  Ask your health care provider whether you should take a calcium or vitamin D supplement to lower your risk for osteoporosis.  Menopause may have certain physical symptoms and risks.  Hormone replacement therapy may reduce some of these symptoms and risks. Talk to your health care provider about whether hormone replacement therapy is right for you.  HOME CARE INSTRUCTIONS   Schedule regular health, dental, and eye exams.  Stay current with your immunizations.   Do not use any tobacco products including cigarettes, chewing tobacco, or electronic cigarettes.  If you are pregnant, do not drink alcohol.  If you are breastfeeding, limit how  much and how often you drink alcohol.  Limit alcohol intake to no more than 1 drink per day for nonpregnant women. One drink equals 12 ounces of beer, 5 ounces of wine, or 1 ounces of hard liquor.  Do not use street drugs.  Do not share needles.  Ask your health care provider for help if you need support or information about quitting drugs.  Tell your health care provider if you often feel depressed.  Tell your health care provider if you have ever been abused or do not feel safe at home.   This information is not intended to replace advice given to you by your health care provider. Make sure you discuss any questions you have with your health care provider.   Document Released: 02/08/2011 Document Revised: 08/16/2014 Document Reviewed: 06/27/2013 [employer] Interactive Patient Education [employer].

## 2015-10-16 NOTE — Progress Notes (Signed)
Pre visit review using our clinic review tool, if applicable. No additional management support is needed unless otherwise documented below in the visit note.  Chief Complaint  Patient presents with  . Annual Exam    HPI: Patient  [name]  40 y.o. comes in today for Preventive Health Care visit  Generally well but still has  Intermittent bladder sx "hasn't passd the stone yet"! No hematuria fever . hasn't seen urology  bp has been better  Has fam hx utd on gyne check HAs not that frequent but want refill cause traveling soon  Health Maintenance  Topic Date Due  . HIV Screening  11/10/2015 (Originally 10/17/1990)  . INFLUENZA VACCINE  03/09/2016  . PAP SMEAR  03/24/2018  . TETANUS/TDAP  11/19/2024   Health Maintenance Review LIFESTYLE:  Exercise:  Always   Tobacco/ETS: no Alcohol: ocass  Sugar beverages: no Sleep: 7 hours  Drug use: no Has iud  5 years  ROS: had episode of pounding heart anxiety and righ arm pressure  Not assoc with exercise orther GEN/ HEENT: No fever, significant weight changes sweats headaches vision problems hearing changes, CV/ PULM; No chest pain shortness of breath cough, syncope,edema  change in exercise tolerance. GI /GU: No adominal pain, vomiting, change in bowel habits. No blood in the stool. No significant GU symptoms. SKIN/HEME: ,no acute skin rashes suspicious lesions or bleeding. No lymphadenopathy, nodules, masses.  NEURO/ PSYCH:  No neurologic signs such as weakness numbness. No depression anxiety. IMM/ Allergy: No unusual infections.  Allergy .   REST of 12 system review negative except as per HPI   Past Medical History  Diagnosis Date  . Migraine   . Hx of varicella   . Endometriosis     surgery 2009    Past Surgical History  Procedure Laterality Date  . Tonsillectomy  85  . Nasal septum surgery  feb 16     [facility] Dr Beverely Low  . Ablation on endometriosis  2009    Family History  Problem Relation Age of Onset  .  Hypertension Mother   . Hypertension Father   . Breast cancer Mother     age 67   . Heart failure Mother     after breast cancer rx has icdalso     Social History   Social History  . Marital Status: Married    Spouse Name: N/A  . Number of Children: N/A  . Years of Education: N/A   Social History Main Topics  . Smoking status: Never Smoker   . Smokeless tobacco: Never Used  . Alcohol Use: 0.0 oz/week    0 Standard drinks or equivalent per week  . Drug Use: None  . Sexual Activity:    Partners: Male   Other Topics Concern  . None   Social History Narrative   6-8 hours of sleep per night   Does not work outside home   Married for 12 years with 2 children   Children are 19yr and 5 yrs.   No pets   etoh social    Hot yoga  And walking    g2 p2   [place] [place]   Outpatient Prescriptions Prior to Visit  Medication Sig Dispense Refill  . Biotin 5000 MCG CAPS Take by mouth.    .Marland Kitchenibuprofen (ADVIL,MOTRIN) 800 MG tablet Take 1 tablet (800 mg total) by mouth 3 (three) times daily. 21 tablet 0  .  IRON PO Take by mouth.    . levonorgestrel (MIRENA) 20 MCG/24HR IUD by Intrauterine route.    . Omega-3 Fatty Acids (FISH OIL) 1200 MG CAPS Take by mouth.    . Diclofenac Potassium (ZIPSOR) 25 MG CAPS Take 1 every 8 hours as needed for migraine headache. 30 capsule 3  . ZOLMitriptan (ZOMIG) 2.5 MG tablet Take 1 tablet (2.5 mg total) by mouth as needed for migraine. 10 tablet 2   No facility-administered medications prior to visit.     EXAM:  BP 124/80 mmHg  Temp(Src) 98.5 F (36.9 C) (Oral)  Ht '5\' 3"'$  (1.6 m)  Wt 123 lb 8 oz (56.019 kg)  BMI 21.88 kg/m2  Body mass index is 21.88 kg/(m^2).  Physical Exam: Vital signs reviewed RKY:HCWC is a well-developed well-nourished alert cooperative    who appearsr stated age in no acute distress.  HEENT: normocephalic atraumatic , Eyes: PERRL EOM's full,  conjunctiva clear, Nares: paten,t no deformity discharge or tenderness., Ears: no deformity EAC's clear TMs with normal landmarks. Mouth: clear OP, no lesions, edema.  Moist mucous membranes. Dentition in adequate repair. NECK: supple without masses, thyromegaly or bruits. CHEST/PULM:  Clear to auscultation and percussion breath sounds equal no wheeze , rales or rhonchi. No chest wall deformities or tenderness.Breast: normal by inspection . No dimpling, discharge, masses, tenderness or discharge . CV: PMI is nondisplaced, S1 S2 no gallops, murmurs, rubs. Peripheral pulses are full without delay.No JVD .  ABDOMEN: Bowel sounds normal nontender  No guard or rebound, no hepato splenomegal no CVA tenderness.  No hernia. Extremtities:  No clubbing cyanosis or edema, no acute joint swelling or redness no focal atrophy NEURO:  Oriented x3, cranial nerves 3-12 appear to be intact, no obvious focal weakness,gait within normal limits no abnormal reflexes or asymmetrical SKIN: No acute rashes normal turgor, color, no bruising or petechiae. PSYCH: Oriented, good eye contact, no obvious depression anxiety, cognition and judgment appear normal. LN: no cervical axillary inguinal adenopathy  Lab Results  Component Value Date   WBC 4.2 10/06/2015   HGB 12.9 10/06/2015   HCT 38.3 10/06/2015   PLT 201.0 10/06/2015   GLUCOSE 85 10/06/2015   CHOL 144 10/06/2015   TRIG 72.0 10/06/2015   HDL 49.80 10/06/2015   LDLCALC 80 10/06/2015   ALT 13 10/06/2015   AST 17 10/06/2015   NA 140 10/06/2015   K 3.9 10/06/2015   CL 107 10/06/2015   CREATININE 0.79 10/06/2015   BUN 14 10/06/2015   CO2 25 10/06/2015   TSH 1.47 10/06/2015   BP Readings from Last 3 Encounters:  10/17/15 124/80  08/22/15 142/100  02/14/15 122/82   EKG NSR brady rate 53   ASSESSMENT AND PLAN:  Discussed the following assessment and plan:  Visit for preventive health examination - Plan: EKG 12-Lead  Screening for cardiovascular  condition  Renal stone  left by ct no obst  Medication management  Recurrent headache - better  since deviated septum  surgery   Family hx of hypertension  Eosinophilia - ? is significnat  - Plan: Eosinophil count, CBC with Differential/Platelet  Nasal obstruction  Lower urinary tract symptoms (LUTS) Continue luts sx and hs of stone upper  Has seen gyne  Repeat eosin   Count   In amonth  Many things are better after nose surgery  Patient Care Team: Burnis Medin, MD as PCP - General (Internal Medicine) Vanessa Kick, MD as Consulting Physician (Obstetrics and Gynecology) Glennie Isle, PA-C (Physician  Assistant) Patient Instructions    Continue lifestyle intervention healthy eating and exercise . You will be contacted about urology referral for ongoing  Symptoms  And renal stone  Uncertain cause of the elevated  Eosinophils   May be clinically insignificant  Repeat cbc and esosiophil count  Yearly visit   Otherwise   Health Maintenance, Female Adopting a healthy lifestyle and getting preventive care can go a long way to promote health and wellness. Talk with your health care provider about what schedule of regular examinations is right for you. This is a good chance for you to check in with your provider about disease prevention and staying healthy. In between checkups, there are plenty of things you can do on your own. Experts have done a lot of research about which lifestyle changes and preventive measures are most likely to keep you healthy. Ask your health care provider for more information. WEIGHT AND DIET  Eat a healthy diet  Be sure to include plenty of vegetables, fruits, low-fat dairy products, and lean protein.  Do not eat a lot of foods high in solid fats, added sugars, or salt.  Get regular exercise. This is one of the most important things you can do for your health.  Most adults should exercise for at least 150 minutes each week. The exercise should increase  your heart rate and make you sweat (moderate-intensity exercise).  Most adults should also do strengthening exercises at least twice a week. This is in addition to the moderate-intensity exercise.  Maintain a healthy weight  Body mass index (BMI) is a measurement that can be used to identify possible weight problems. It estimates body fat based on height and weight. Your health care provider can help determine your BMI and help you achieve or maintain a healthy weight.  For females 55 years of age and older:   A BMI below 18.5 is considered underweight.  A BMI of 18.5 to 24.9 is normal.  A BMI of 25 to 29.9 is considered overweight.  A BMI of 30 and above is considered obese.  Watch levels of cholesterol and blood lipids  You should start having your blood tested for lipids and cholesterol at 40 years of age, then have this test every 5 years.  You may need to have your cholesterol levels checked more often if:  Your lipid or cholesterol levels are high.  You are older than 40 years of age.  You are at high risk for heart disease.  CANCER SCREENING   Lung Cancer  Lung cancer screening is recommended for adults 41-87 years old who are at high risk for lung cancer because of a history of smoking.  A yearly low-dose CT scan of the lungs is recommended for people who:  Currently smoke.  Have quit within the past 15 years.  Have at least a 30-pack-year history of smoking. A pack year is smoking an average of one pack of cigarettes a day for 1 year.  Yearly screening should continue until it has been 15 years since you quit.  Yearly screening should stop if you develop a health problem that would prevent you from having lung cancer treatment.  Breast Cancer  Practice breast self-awareness. This means understanding how your breasts normally appear and feel.  It also means doing regular breast self-exams. Let your health care provider know about any changes, no matter  how small.  If you are in your 20s or 30s, you should have a clinical breast exam (CBE)  by a health care provider every 1-3 years as part of a regular health exam.  If you are 47 or older, have a CBE every year. Also consider having a breast X-ray (mammogram) every year.  If you have a family history of breast cancer, talk to your health care provider about genetic screening.  If you are at high risk for breast cancer, talk to your health care provider about having an MRI and a mammogram every year.  Breast cancer gene (BRCA) assessment is recommended for women who have family members with BRCA-related cancers. BRCA-related cancers include:  Breast.  Ovarian.  Tubal.  Peritoneal cancers.  Results of the assessment will determine the need for genetic counseling and BRCA1 and BRCA2 testing. Cervical Cancer Your health care provider may recommend that you be screened regularly for cancer of the pelvic organs (ovaries, uterus, and vagina). This screening involves a pelvic examination, including checking for microscopic changes to the surface of your cervix (Pap test). You may be encouraged to have this screening done every 3 years, beginning at age 22.  For women ages 37-65, health care providers may recommend pelvic exams and Pap testing every 3 years, or they may recommend the Pap and pelvic exam, combined with testing for human papilloma virus (HPV), every 5 years. Some types of HPV increase your risk of cervical cancer. Testing for HPV may also be done on women of any age with unclear Pap test results.  Other health care providers may not recommend any screening for nonpregnant women who are considered low risk for pelvic cancer and who do not have symptoms. Ask your health care provider if a screening pelvic exam is right for you.  If you have had past treatment for cervical cancer or a condition that could lead to cancer, you need Pap tests and screening for cancer for at least 20 years  after your treatment. If Pap tests have been discontinued, your risk factors (such as having a new sexual partner) need to be reassessed to determine if screening should resume. Some women have medical problems that increase the chance of getting cervical cancer. In these cases, your health care provider may recommend more frequent screening and Pap tests. Colorectal Cancer  This type of cancer can be detected and often prevented.  Routine colorectal cancer screening usually begins at 40 years of age and continues through 40 years of age.  Your health care provider may recommend screening at an earlier age if you have risk factors for colon cancer.  Your health care provider may also recommend using home test kits to check for hidden blood in the stool.  A small camera at the end of a tube can be used to examine your colon directly (sigmoidoscopy or colonoscopy). This is done to check for the earliest forms of colorectal cancer.  Routine screening usually begins at age 1.  Direct examination of the colon should be repeated every 5-10 years through 40 years of age. However, you may need to be screened more often if early forms of precancerous polyps or small growths are found. Skin Cancer  Check your skin from head to toe regularly.  Tell your health care provider about any new moles or changes in moles, especially if there is a change in a mole's shape or color.  Also tell your health care provider if you have a mole that is larger than the size of a pencil eraser.  Always use sunscreen. Apply sunscreen liberally and repeatedly throughout the day.  Protect yourself by wearing long sleeves, pants, a wide-brimmed hat, and sunglasses whenever you are outside. HEART DISEASE, DIABETES, AND HIGH BLOOD PRESSURE   High blood pressure causes heart disease and increases the risk of stroke. High blood pressure is more likely to develop in:  People who have blood pressure in the high end of the  normal range (130-139/85-89 mm Hg).  People who are overweight or obese.  People who are African American.  If you are 62-21 years of age, have your blood pressure checked every 3-5 years. If you are 83 years of age or older, have your blood pressure checked every year. You should have your blood pressure measured twice--once when you are at a hospital or clinic, and once when you are not at a hospital or clinic. Record the average of the two measurements. To check your blood pressure when you are not at a hospital or clinic, you can use:  An automated blood pressure machine at a pharmacy.  A home blood pressure monitor.  If you are between 54 years and 81 years old, ask your health care provider if you should take aspirin to prevent strokes.  Have regular diabetes screenings. This involves taking a blood sample to check your fasting blood sugar level.  If you are at a normal weight and have a low risk for diabetes, have this test once every three years after 40 years of age.  If you are overweight and have a high risk for diabetes, consider being tested at a younger age or more often. PREVENTING INFECTION  Hepatitis B  If you have a higher risk for hepatitis B, you should be screened for this virus. You are considered at high risk for hepatitis B if:  You were born in a country where hepatitis B is common. Ask your health care provider which countries are considered high risk.  Your parents were born in a high-risk country, and you have not been immunized against hepatitis B (hepatitis B vaccine).  You have HIV or AIDS.  You use needles to inject street drugs.  You live with someone who has hepatitis B.  You have had sex with someone who has hepatitis B.  You get hemodialysis treatment.  You take certain medicines for conditions, including cancer, organ transplantation, and autoimmune conditions. Hepatitis C  Blood testing is recommended for:  Everyone born from [birth year]  through 1965.  Anyone with known risk factors for hepatitis C. Sexually transmitted infections (STIs)  You should be screened for sexually transmitted infections (STIs) including gonorrhea and chlamydia if:  You are sexually active and are younger than 40 years of age.  You are older than 40 years of age and your health care provider tells you that you are at risk for this type of infection.  Your sexual activity has changed since you were last screened and you are at an increased risk for chlamydia or gonorrhea. Ask your health care provider if you are at risk.  If you do not have HIV, but are at risk, it may be recommended that you take a prescription medicine daily to prevent HIV infection. This is called pre-exposure prophylaxis (PrEP). You are considered at risk if:  You are sexually active and do not regularly use condoms or know the HIV status of your partner(s).  You take drugs by injection.  You are sexually active with a partner who has HIV. Talk with your health care provider about whether you are at high risk of being infected  with HIV. If you choose to begin PrEP, you should first be tested for HIV. You should then be tested every 3 months for as long as you are taking PrEP.  PREGNANCY   If you are premenopausal and you may become pregnant, ask your health care provider about preconception counseling.  If you may become pregnant, take 400 to 800 micrograms (mcg) of folic acid every day.  If you want to prevent pregnancy, talk to your health care provider about birth control (contraception). OSTEOPOROSIS AND MENOPAUSE   Osteoporosis is a disease in which the bones lose minerals and strength with aging. This can result in serious bone fractures. Your risk for osteoporosis can be identified using a bone density scan.  If you are 85 years of age or older, or if you are at risk for osteoporosis and fractures, ask your health care provider if you should be screened.  Ask your  health care provider whether you should take a calcium or vitamin D supplement to lower your risk for osteoporosis.  Menopause may have certain physical symptoms and risks.  Hormone replacement therapy may reduce some of these symptoms and risks. Talk to your health care provider about whether hormone replacement therapy is right for you.  HOME CARE INSTRUCTIONS   Schedule regular health, dental, and eye exams.  Stay current with your immunizations.   Do not use any tobacco products including cigarettes, chewing tobacco, or electronic cigarettes.  If you are pregnant, do not drink alcohol.  If you are breastfeeding, limit how much and how often you drink alcohol.  Limit alcohol intake to no more than 1 drink per day for nonpregnant women. One drink equals 12 ounces of beer, 5 ounces of wine, or 1 ounces of hard liquor.  Do not use street drugs.  Do not share needles.  Ask your health care provider for help if you need support or information about quitting drugs.  Tell your health care provider if you often feel depressed.  Tell your health care provider if you have ever been abused or do not feel safe at home.   This information is not intended to replace advice given to you by your health care provider. Make sure you discuss any questions you have with your health care provider.   Document Released: 02/08/2011 Document Revised: 08/16/2014 Document Reviewed: 06/27/2013 Elsevier Interactive Patient Education 2016 [place] K. Dierks Wach M.D.

## 2015-10-17 ENCOUNTER — Encounter: Payer: Self-pay | Admitting: Internal Medicine

## 2015-10-17 ENCOUNTER — Ambulatory Visit (INDEPENDENT_AMBULATORY_CARE_PROVIDER_SITE_OTHER): Payer: BLUE CROSS/BLUE SHIELD | Admitting: Internal Medicine

## 2015-10-17 VITALS — BP 124/80 | Temp 98.5°F | Ht 63.0 in | Wt 123.5 lb

## 2015-10-17 DIAGNOSIS — Z136 Encounter for screening for cardiovascular disorders: Secondary | ICD-10-CM | POA: Diagnosis not present

## 2015-10-17 DIAGNOSIS — R51 Headache: Secondary | ICD-10-CM | POA: Diagnosis not present

## 2015-10-17 DIAGNOSIS — Z0000 Encounter for general adult medical examination without abnormal findings: Secondary | ICD-10-CM | POA: Diagnosis not present

## 2015-10-17 DIAGNOSIS — D721 Eosinophilia, unspecified: Secondary | ICD-10-CM

## 2015-10-17 DIAGNOSIS — N200 Calculus of kidney: Secondary | ICD-10-CM

## 2015-10-17 DIAGNOSIS — Z8249 Family history of ischemic heart disease and other diseases of the circulatory system: Secondary | ICD-10-CM | POA: Diagnosis not present

## 2015-10-17 DIAGNOSIS — J3489 Other specified disorders of nose and nasal sinuses: Secondary | ICD-10-CM

## 2015-10-17 DIAGNOSIS — Z79899 Other long term (current) drug therapy: Secondary | ICD-10-CM

## 2015-10-17 DIAGNOSIS — R519 Headache, unspecified: Secondary | ICD-10-CM

## 2015-10-17 DIAGNOSIS — R399 Unspecified symptoms and signs involving the genitourinary system: Secondary | ICD-10-CM

## 2015-10-17 LAB — EKG 12-LEAD

## 2015-10-17 MED ORDER — DICLOFENAC POTASSIUM 25 MG PO CAPS: ORAL_CAPSULE | ORAL | Status: DC

## 2015-10-17 MED ORDER — ZOLMITRIPTAN 2.5 MG PO TABS: 2.5000 mg | ORAL_TABLET | ORAL | Status: DC | PRN

## 2015-11-17 ENCOUNTER — Telehealth: Payer: Self-pay | Admitting: General Practice

## 2015-11-17 NOTE — Telephone Encounter (Signed)
[employer] changed Key # to [id number]

## 2015-11-17 NOTE — Telephone Encounter (Signed)
PA began for Zipsor 25 mg in CMM. (Key # [id number])

## 2015-11-18 ENCOUNTER — Other Ambulatory Visit: Payer: Self-pay | Admitting: General Practice

## 2015-11-24 ENCOUNTER — Other Ambulatory Visit (INDEPENDENT_AMBULATORY_CARE_PROVIDER_SITE_OTHER): Payer: BLUE CROSS/BLUE SHIELD

## 2015-11-24 DIAGNOSIS — D721 Eosinophilia, unspecified: Secondary | ICD-10-CM

## 2015-11-24 LAB — CBC WITH DIFFERENTIAL/PLATELET
BASOS PCT: 0.3 % (ref 0.0–3.0)
Basophils Absolute: 0 10*3/uL (ref 0.0–0.1)
EOS ABS: 0.4 10*3/uL (ref 0.0–0.7)
EOS PCT: 7.6 % — AB (ref 0.0–5.0)
HCT: 40 % (ref 36.0–46.0)
HEMOGLOBIN: 13.6 g/dL (ref 12.0–15.0)
LYMPHS PCT: 20 % (ref 12.0–46.0)
Lymphs Abs: 1.2 10*3/uL (ref 0.7–4.0)
MCHC: 33.8 g/dL (ref 30.0–36.0)
MCV: 92.1 fl (ref 78.0–100.0)
MONO ABS: 0.3 10*3/uL (ref 0.1–1.0)
Monocytes Relative: 5.3 % (ref 3.0–12.0)
NEUTROS PCT: 66.8 % (ref 43.0–77.0)
Neutro Abs: 3.9 10*3/uL (ref 1.4–7.7)
Platelets: 183 10*3/uL (ref 150.0–400.0)
RBC: 4.35 Mil/uL (ref 3.87–5.11)
RDW: 13.3 % (ref 11.5–15.5)
WBC: 5.8 10*3/uL (ref 4.0–10.5)

## 2015-11-25 ENCOUNTER — Telehealth: Payer: Self-pay | Admitting: Internal Medicine

## 2015-11-25 ENCOUNTER — Encounter: Payer: Self-pay | Admitting: Internal Medicine

## 2015-11-25 LAB — EOSINOPHIL COUNT: Eosinophils Absolute: 420 cells/uL (ref 15–500)

## 2015-11-25 NOTE — Telephone Encounter (Signed)
See final result note   Repeat  Tests is better   No increase  Absolute  eos cell count     This is reassuring and   Normal Will just do   Yearly following

## 2015-11-25 NOTE — Telephone Encounter (Signed)
Pt would like blood work results °

## 2015-11-26 NOTE — Telephone Encounter (Signed)
Pt viewed on MyChart 11/25/15 @ 7:26 PM.  She sent a MyChart message to Dr. Fabian SharpPanosh.  See message.

## 2015-11-28 NOTE — Telephone Encounter (Signed)
I dont see this as a disease process  At this level .     we  Can keep a 6- 12  Month  check on this  And if goes way out of range absolute   Can do further evaluation however  It is not high enough  To cause alarm  At this level.

## 2015-12-01 DIAGNOSIS — Z0000 Encounter for general adult medical examination without abnormal findings: Secondary | ICD-10-CM | POA: Diagnosis not present

## 2015-12-01 DIAGNOSIS — N201 Calculus of ureter: Secondary | ICD-10-CM | POA: Diagnosis not present

## 2015-12-01 DIAGNOSIS — N132 Hydronephrosis with renal and ureteral calculous obstruction: Secondary | ICD-10-CM | POA: Diagnosis not present

## 2015-12-03 ENCOUNTER — Telehealth: Payer: Self-pay | Admitting: Internal Medicine

## 2015-12-03 ENCOUNTER — Encounter: Payer: Self-pay | Admitting: Internal Medicine

## 2015-12-03 ENCOUNTER — Ambulatory Visit (INDEPENDENT_AMBULATORY_CARE_PROVIDER_SITE_OTHER): Payer: BLUE CROSS/BLUE SHIELD | Admitting: Internal Medicine

## 2015-12-03 VITALS — BP 110/80 | HR 76 | Temp 98.5°F | Wt 127.4 lb

## 2015-12-03 DIAGNOSIS — Z8249 Family history of ischemic heart disease and other diseases of the circulatory system: Secondary | ICD-10-CM | POA: Diagnosis not present

## 2015-12-03 DIAGNOSIS — R079 Chest pain, unspecified: Secondary | ICD-10-CM

## 2015-12-03 NOTE — Patient Instructions (Addendum)
Exam and  ekg are reassuring ly normal.  Suspecting  Antiinflammatories could  Cause the problem as a GI cause .    Avoid advil aleve temporarily  Unless abolutely necessary .  Add otc omeprazole  ( prilosec )  Every day  For 3 weeks  Then ROV  . If   persistent or progressive we can get more evaluation.   Seek emergent care of severe pain with shortness of breath, vomiting  Fever  In the interim  .

## 2015-12-03 NOTE — Progress Notes (Signed)
Chief Complaint  Patient presents with  . Chest Pain    comes and go     HPI: [name] 40 y.o.  Comes in with 4 day hx of cp off and on and more frequent last minutes of mid lower chest pressure without nv sweating sob cough  And not related to eating or exercise . Concern because of fam hx of  early heart disease and failure mom age 40 ( had hs breast cancer and chemo)  In [place]  And also a mgmon. She had gone to field trip and sciatica acted up a nd took reg advil aleve at that  time no exercise intolerance  pnd fever radiation ROS: See pertinent positives and negatives per HPI. No new ha medicine    Past Medical History  Diagnosis Date  . Migraine   . Hx of varicella   . Endometriosis     surgery 2009    Family History  Problem Relation Age of Onset  . Hypertension Mother   . Hypertension Father   . Breast cancer Mother     age 265   . Heart failure Mother     after breast cancer rx has icdalso     Social History   Social History  . Marital Status: Married    Spouse Name: N/A  . Number of Children: N/A  . Years of Education: N/A   Social History Main Topics  . Smoking status: Never Smoker   . Smokeless tobacco: Never Used  . Alcohol Use: 0.0 oz/week    0 Standard drinks or equivalent per week  . Drug Use: None  . Sexual Activity:    Partners: Male   Other Topics Concern  . None   Social History Narrative   6-8 hours of sleep per night   Does not work outside home   Married for 12 years with 2 children   Children are 3558yrs and 5 yrs.   No pets   etoh social    Hot yoga  And walking    g2 p2   Born [place]   College degree [employer]    Outpatient Prescriptions Prior to Visit  Medication Sig Dispense Refill  . Biotin 5000 MCG CAPS Take by mouth.    . Diclofenac Potassium (ZIPSOR) 25 MG CAPS Take 1 every 8 hours as needed for migraine headache. 30 capsule 2  . ibuprofen (ADVIL,MOTRIN) 800 MG tablet Take  1 tablet (800 mg total) by mouth 3 (three) times daily. 21 tablet 0  . IRON PO Take by mouth.    . levonorgestrel (MIRENA) 20 MCG/24HR IUD by Intrauterine route.    . Omega-3 Fatty Acids (FISH OIL) 1200 MG CAPS Take by mouth.    Marland Kitchen. ZOLMitriptan (ZOMIG) 2.5 MG tablet Take 1 tablet (2.5 mg total) by mouth as needed for migraine. 10 tablet 2   No facility-administered medications prior to visit.     EXAM:  BP 110/80 mmHg  Pulse 76  Temp(Src) 98.5 F (36.9 C) (Oral)  Wt 127 lb 6.4 oz (57.788 kg)  Body mass index is 22.57 kg/(m^2).  GENERAL: vitals reviewed and listed above, alert, oriented, appears well hydrated and in no acute distress HEENT: atraumatic, conjunctiva  clear, no obvious abnormalities on inspection of external nose and earsNECK: no obvious masses on inspection palpation  LUNGS: clear to auscultation bilaterally, no wheezes, rales or rhonchi, good air movement CV: HRRR, s1 s2 no g or murmur  no clubbing cyanosis or  peripheral edema nl cap refill  No cw tenderness  Abdomen:  Sof,t normal bowel sounds without hepatosplenomegaly, no guarding rebound or masses no CVA tenderness MS: moves all extremities without noticeable focal  abnormality PSYCH: pleasant and cooperative, no obvious depression or anxiety EKG NSR no acute findings ASSESSMENT AND PLAN:  Discussed the following assessment and plan:  Chest pain, unspecified chest pain type - am hx of  Heart Disease  chf  and icd momTrial ppi and rov in 3 weeks  - Plan: EKG 12-Lead, DG Chest 2 View  Family history of heart disease in female family member before age 40  Family hx of hypertension fam hx of  Heart Disease  chf  and icd mom Trial ppi and rov in 3 weeks  -Patient advised to return or notify health care team  if symptoms worsen ,persist or new concerns arise.  Patient Instructions  Exam and  ekg are reassuring ly normal.  Suspecting  Antiinflammatories could  Cause the problem as a GI cause .    Avoid advil  aleve temporarily  Unless abolutely necessary .  Add otc omeprazole  ( prilosec )  Every day  For 3 weeks  Then ROV  . If   persistent or progressive we can get more evaluation.   Seek emergent care of severe pain with shortness of breath, vomiting  Fever  In the interim  .    Neta Mends. Siena Poehler M.D.

## 2015-12-03 NOTE — Telephone Encounter (Signed)
Appointment today at 11:30 with Dr. Fabian SharpPanosh

## 2015-12-03 NOTE — Telephone Encounter (Signed)
Error/can transferred to team health

## 2015-12-03 NOTE — Telephone Encounter (Signed)
Patient Name: [name]  DOB: 03/29/1976    Initial Comment caller states she has had a dull pain in her chest for the past few days that comes and goes    Nurse Assessment  Nurse: Nicole CooterHenry, [name], [name] Date/Time Nicole Cohen(Eastern Time): 12/03/2015 9:51:59 AM  Confirm and document reason for call. If symptomatic, describe symptoms. You must click the next button to save text entered. ---Caller states that she has been having some chest pain that comes and goes for the past few days. She denies the pain at present. It is happening more frequently today. It is not increasing in its intensity. She denies history of heart problems. She describes the pain as a dull achy pain and its at her sternum. It only lasts a few seconds when she has it.  Has the patient traveled out of the country within the last 30 days? ---No  Does the patient have any new or worsening symptoms? ---Yes  Will a triage be completed? ---Yes  Related visit to physician within the last 2 weeks? ---No  Does the PT have any chronic conditions? (i.e. diabetes, asthma, etc.) ---No  Is the patient pregnant or possibly pregnant? (Ask all females between the ages of 1312-55) ---No  Is this a behavioral health or substance abuse call? ---No     Guidelines    Guideline Title Affirmed Question Affirmed Notes  Chest Pain [1] Intermittent chest pain or "angina" AND [2] increasing in severity or frequency (Exception: pains lasting a few seconds)    Final Disposition User   Go to ED Now Nicole CooterHenry, [name], [name]    Comments  Caller does not want to go to the ER. She wants to be seen by Dr. Fabian Bradshaw. Advised her i will forward this information to the office and someone will be calling her back.  I called the backline and [facility] states that she has an appointment at 11:00.   Referrals  REFERRED TO PCP OFFICE   Disagree/Comply: Disagree  Disagree/Comply Reason: Disagree with instructions

## 2015-12-25 ENCOUNTER — Ambulatory Visit: Payer: BLUE CROSS/BLUE SHIELD | Admitting: Internal Medicine

## 2016-01-20 DIAGNOSIS — N201 Calculus of ureter: Secondary | ICD-10-CM | POA: Diagnosis not present

## 2016-01-20 DIAGNOSIS — R8271 Bacteriuria: Secondary | ICD-10-CM | POA: Diagnosis not present

## 2016-01-26 ENCOUNTER — Other Ambulatory Visit: Payer: Self-pay | Admitting: Urology

## 2016-02-03 ENCOUNTER — Encounter (HOSPITAL_BASED_OUTPATIENT_CLINIC_OR_DEPARTMENT_OTHER): Payer: Self-pay | Admitting: *Deleted

## 2016-02-04 ENCOUNTER — Encounter (HOSPITAL_BASED_OUTPATIENT_CLINIC_OR_DEPARTMENT_OTHER): Payer: Self-pay | Admitting: *Deleted

## 2016-02-04 NOTE — Progress Notes (Signed)
NPO AFTER MN.  ARRIVE AT 1000.  NEEDS HG AND URINE PREG.  MAY TAKE MIGRAINE PRN MEDS AM DOS W/ SIPS OF WATER IF NEEDED.

## 2016-02-12 DIAGNOSIS — N201 Calculus of ureter: Secondary | ICD-10-CM | POA: Diagnosis not present

## 2016-02-12 DIAGNOSIS — R3121 Asymptomatic microscopic hematuria: Secondary | ICD-10-CM | POA: Diagnosis not present

## 2016-02-13 ENCOUNTER — Ambulatory Visit (HOSPITAL_BASED_OUTPATIENT_CLINIC_OR_DEPARTMENT_OTHER)
Admission: RE | Admit: 2016-02-13 | Discharge: 2016-02-13 | Disposition: A | Discharge status: Home / Self Care | Payer: BLUE CROSS/BLUE SHIELD | Source: Ambulatory Visit | Attending: Urology | Admitting: Urology

## 2016-02-13 ENCOUNTER — Ambulatory Visit (HOSPITAL_BASED_OUTPATIENT_CLINIC_OR_DEPARTMENT_OTHER): Payer: BLUE CROSS/BLUE SHIELD | Admitting: Anesthesiology

## 2016-02-13 ENCOUNTER — Encounter (HOSPITAL_BASED_OUTPATIENT_CLINIC_OR_DEPARTMENT_OTHER)
Admission: RE | Disposition: A | Discharge status: Home / Self Care | Payer: Self-pay | Source: Ambulatory Visit | Attending: Urology

## 2016-02-13 ENCOUNTER — Encounter (HOSPITAL_BASED_OUTPATIENT_CLINIC_OR_DEPARTMENT_OTHER): Payer: Self-pay | Admitting: Anesthesiology

## 2016-02-13 DIAGNOSIS — N201 Calculus of ureter: Secondary | ICD-10-CM | POA: Insufficient documentation

## 2016-02-13 DIAGNOSIS — N200 Calculus of kidney: Secondary | ICD-10-CM | POA: Diagnosis not present

## 2016-02-13 HISTORY — DX: Elevated blood-pressure reading, without diagnosis of hypertension: R03.0

## 2016-02-13 HISTORY — DX: Calculus of ureter: N20.1

## 2016-02-13 HISTORY — DX: Urgency of urination: R39.15

## 2016-02-13 HISTORY — PX: CYSTOSCOPY/RETROGRADE/URETEROSCOPY/STONE EXTRACTION WITH BASKET: SHX5317

## 2016-02-13 HISTORY — DX: Other specified postprocedural states: Z98.890

## 2016-02-13 HISTORY — DX: Other specified postprocedural states: R11.2

## 2016-02-13 HISTORY — DX: Frequency of micturition: R35.0

## 2016-02-13 LAB — POCT HEMOGLOBIN-HEMACUE: HEMOGLOBIN: 13.4 g/dL (ref 12.0–15.0)

## 2016-02-13 LAB — POCT PREGNANCY, URINE
PREG TEST UR: NEGATIVE
PREG TEST UR: NEGATIVE

## 2016-02-13 SURGERY — CYSTOSCOPY, WITH CALCULUS REMOVAL USING BASKET
Anesthesia: General | Site: Ureter | Laterality: Left

## 2016-02-13 MED ORDER — DEXAMETHASONE SODIUM PHOSPHATE 4 MG/ML IJ SOLN
INTRAMUSCULAR | Status: DC | PRN
  Administered 2016-02-13: 10 mg via INTRAVENOUS

## 2016-02-13 MED ORDER — EPHEDRINE SULFATE-NACL 50-0.9 MG/10ML-% IV SOSY
PREFILLED_SYRINGE | INTRAVENOUS | Status: DC | PRN
  Administered 2016-02-13 (×2): 10 mg via INTRAVENOUS

## 2016-02-13 MED ORDER — SCOPOLAMINE 1 MG/3DAYS TD PT72
MEDICATED_PATCH | TRANSDERMAL | Status: AC
  Filled 2016-02-13: qty 1

## 2016-02-13 MED ORDER — FENTANYL CITRATE (PF) 100 MCG/2ML IJ SOLN
INTRAMUSCULAR | Status: DC | PRN
  Administered 2016-02-13: 100 ug via INTRAVENOUS

## 2016-02-13 MED ORDER — FENTANYL CITRATE (PF) 100 MCG/2ML IJ SOLN
INTRAMUSCULAR | Status: AC
  Filled 2016-02-13: qty 2

## 2016-02-13 MED ORDER — PROPOFOL 10 MG/ML IV BOLUS
INTRAVENOUS | Status: AC
  Filled 2016-02-13: qty 20

## 2016-02-13 MED ORDER — DIATRIZOATE MEGLUMINE 30 % UR SOLN
URETHRAL | Status: DC | PRN
  Administered 2016-02-13: 10 mL via URETHRAL

## 2016-02-13 MED ORDER — LACTATED RINGERS IV SOLN
INTRAVENOUS | Status: DC
  Administered 2016-02-13: 12:00:00 via INTRAVENOUS
  Filled 2016-02-13: qty 1000

## 2016-02-13 MED ORDER — MIDAZOLAM HCL 2 MG/2ML IJ SOLN
INTRAMUSCULAR | Status: AC
  Filled 2016-02-13: qty 2

## 2016-02-13 MED ORDER — CEFAZOLIN IN D5W 1 GM/50ML IV SOLN
1.0000 g | INTRAVENOUS | Status: DC
  Filled 2016-02-13: qty 50

## 2016-02-13 MED ORDER — CEFAZOLIN SODIUM-DEXTROSE 2-4 GM/100ML-% IV SOLN
2.0000 g | INTRAVENOUS | Status: AC
Start: 2016-02-13 — End: 2016-02-13
  Administered 2016-02-13: 2 g via INTRAVENOUS
  Filled 2016-02-13: qty 100

## 2016-02-13 MED ORDER — MIDAZOLAM HCL 5 MG/5ML IJ SOLN
INTRAMUSCULAR | Status: DC | PRN
  Administered 2016-02-13: 2 mg via INTRAVENOUS

## 2016-02-13 MED ORDER — OXYCODONE-ACETAMINOPHEN 5-325 MG PO TABS
1.0000 | ORAL_TABLET | ORAL | Status: DC | PRN
Start: 2016-02-13 — End: 2016-10-25

## 2016-02-13 MED ORDER — DEXAMETHASONE SODIUM PHOSPHATE 10 MG/ML IJ SOLN
INTRAMUSCULAR | Status: AC
  Filled 2016-02-13: qty 1

## 2016-02-13 MED ORDER — CEFAZOLIN SODIUM-DEXTROSE 2-4 GM/100ML-% IV SOLN
INTRAVENOUS | Status: AC
Start: 2016-02-13 — End: 2016-02-13
  Filled 2016-02-13: qty 100

## 2016-02-13 MED ORDER — SULFAMETHOXAZOLE-TRIMETHOPRIM 800-160 MG PO TABS: 1.0000 | ORAL_TABLET | Freq: Two times a day (BID) | ORAL | Status: DC

## 2016-02-13 MED ORDER — NALOXONE HCL 0.4 MG/ML IJ SOLN
INTRAMUSCULAR | Status: DC | PRN
  Administered 2016-02-13 (×2): 40 ug via INTRAVENOUS

## 2016-02-13 MED ORDER — ONDANSETRON HCL 4 MG/2ML IJ SOLN
INTRAMUSCULAR | Status: DC | PRN
  Administered 2016-02-13: 4 mg via INTRAVENOUS

## 2016-02-13 MED ORDER — SCOPOLAMINE 1 MG/3DAYS TD PT72
MEDICATED_PATCH | TRANSDERMAL | Status: DC | PRN
  Administered 2016-02-13: 1 via TRANSDERMAL

## 2016-02-13 MED ORDER — ONDANSETRON HCL 4 MG/2ML IJ SOLN
INTRAMUSCULAR | Status: AC
  Filled 2016-02-13: qty 2

## 2016-02-13 MED ORDER — PROPOFOL 10 MG/ML IV BOLUS
INTRAVENOUS | Status: DC | PRN
  Administered 2016-02-13: 200 mg via INTRAVENOUS

## 2016-02-13 MED ORDER — LIDOCAINE HCL (CARDIAC) 20 MG/ML IV SOLN
INTRAVENOUS | Status: DC | PRN
Start: 2016-02-13 — End: 2016-02-13
  Administered 2016-02-13: 80 mg via INTRAVENOUS

## 2016-02-13 MED ORDER — SODIUM CHLORIDE 0.9 % IR SOLN
Status: DC | PRN
  Administered 2016-02-13: 3000 mL
  Administered 2016-02-13: 1000 mL

## 2016-02-13 SURGICAL SUPPLY — 27 items
ADAPTER CATH URET PLST 4-6FR (CATHETERS) IMPLANT
BAG DRAIN URO-CYSTO SKYTR STRL (DRAIN) ×3 IMPLANT
BASKET DAKOTA 1.9FR 11X120 (BASKET) ×3 IMPLANT
BASKET STNLS GEMINI 4WIRE 3FR (BASKET) IMPLANT
BASKET ZERO TIP NITINOL 2.4FR (BASKET) IMPLANT
CANISTER SUCT LVC 12 LTR MEDI- (MISCELLANEOUS) ×3 IMPLANT
CATH INTERMIT  6FR 70CM (CATHETERS) IMPLANT
CLOTH BEACON ORANGE TIMEOUT ST (SAFETY) ×3 IMPLANT
FIBER LASER FLEXIVA 365 (UROLOGICAL SUPPLIES) IMPLANT
FIBER LASER TRAC TIP (UROLOGICAL SUPPLIES) IMPLANT
GLOVE BIO SURGEON STRL SZ8 (GLOVE) ×3 IMPLANT
GOWN STRL REUS W/ TWL LRG LVL3 (GOWN DISPOSABLE) ×2 IMPLANT
GOWN STRL REUS W/ TWL XL LVL3 (GOWN DISPOSABLE) ×2 IMPLANT
GOWN STRL REUS W/TWL LRG LVL3 (GOWN DISPOSABLE) ×1
GOWN STRL REUS W/TWL XL LVL3 (GOWN DISPOSABLE) ×1
GUIDEWIRE 0.038 PTFE COATED (WIRE) IMPLANT
GUIDEWIRE ANG ZIPWIRE 038X150 (WIRE) IMPLANT
GUIDEWIRE STR DUAL SENSOR (WIRE) IMPLANT
IV NS IRRIG 3000ML ARTHROMATIC (IV SOLUTION) ×3 IMPLANT
KIT ROOM TURNOVER WOR (KITS) ×3 IMPLANT
LASER FIBER DISP (UROLOGICAL SUPPLIES) IMPLANT
MANIFOLD NEPTUNE II (INSTRUMENTS) IMPLANT
NS IRRIG 500ML POUR BTL (IV SOLUTION) IMPLANT
PACK CYSTO (CUSTOM PROCEDURE TRAY) ×3 IMPLANT
SHEATH ACCESS URETERAL 24CM (SHEATH) ×3 IMPLANT
SHEATH ACCESS URETERAL 38CM (SHEATH) IMPLANT
TUBE CONNECTING 12X1/4 (SUCTIONS) IMPLANT

## 2016-02-13 NOTE — Anesthesia Preprocedure Evaluation (Addendum)
Anesthesia Evaluation  Patient identified by MRN, date of birth, ID band Patient awake    History of Anesthesia Complications (+) PONV  Airway Mallampati: II  TM Distance: >3 FB Neck ROM: Full    Dental  (+) Teeth Intact, Dental Advisory Given   Pulmonary    Pulmonary exam normal        Cardiovascular Exercise Tolerance: Good Normal cardiovascular exam Rhythm:Regular Rate:Normal     Neuro/Psych  Headaches, negative psych ROS   GI/Hepatic negative GI ROS, Neg liver ROS,   Endo/Other  negative endocrine ROS  Renal/GU negative Renal ROS     Musculoskeletal negative musculoskeletal ROS (+)   Abdominal   Peds  Hematology negative hematology ROS (+)   Anesthesia Other Findings   Reproductive/Obstetrics negative OB ROS                            Anesthesia Physical Anesthesia Plan  ASA: II  Anesthesia Plan: General   Post-op Pain Management:    Induction: Intravenous  Airway Management Planned: LMA  Additional Equipment:   Intra-op Plan:   Post-operative Plan:   Informed Consent: I have reviewed the patients History and Physical, chart, labs and discussed the procedure including the risks, benefits and alternatives for the proposed anesthesia with the patient or authorized representative who has indicated his/her understanding and acceptance.   Dental advisory given  Plan Discussed with: CRNA and Anesthesiologist  Anesthesia Plan Comments:         Anesthesia Quick Evaluation

## 2016-02-13 NOTE — Discharge Instructions (Signed)
POSTOPERATIVE CARE AFTER URETEROSCOPY ° ° °Diet ° °Once you have adequately recovered from anesthesia, you may gradually advance your diet, as tolerated, to your regular diet. ° °Activities ° °You may gradually increase your activities to your normal unrestricted level the day following your procedure. ° °Medications ° °You should resume all preoperative medications. If you are on aspirin-like compounds, you should not resume these until the blood clears from your urine. If given an antibiotic by the surgeon, take these until they are completed. You may also be given, if you have a stent, medications to decrease the urinary frequency and urgency. ° °Pain ° °After ureteroscopy, there may be some pain on the side of the scope. Take your pain medicine for this. Usually, this pain resolves within a day or 2. ° °Fever ° °Please report any fever over 100° to the doctor. ° °Post Anesthesia Home Care Instructions ° °Activity: °Get plenty of rest for the remainder of the day. A responsible adult should stay with you for 24 hours following the procedure.  °For the next 24 hours, DO NOT: °-Drive a car °-Operate machinery °-Drink alcoholic beverages °-Take any medication unless instructed by your physician °-Make any legal decisions or sign important papers. ° °Meals: °Start with liquid foods such as gelatin or soup. Progress to regular foods as tolerated. Avoid greasy, spicy, heavy foods. If nausea and/or vomiting occur, drink only clear liquids until the nausea and/or vomiting subsides. Call your physician if vomiting continues. ° °Special Instructions/Symptoms: °Your throat may feel dry or sore from the anesthesia or the breathing tube placed in your throat during surgery. If this causes discomfort, gargle with warm salt water. The discomfort should disappear within 24 hours. ° °If you had a scopolamine patch placed behind your ear for the management of post- operative nausea and/or vomiting: ° °1. The medication in the  patch is effective for 72 hours, after which it should be removed.  Wrap patch in a tissue and discard in the trash. Wash hands thoroughly with soap and water. °2. You may remove the patch earlier than 72 hours if you experience unpleasant side effects which may include dry mouth, dizziness or visual disturbances. °3. Avoid touching the patch. Wash your hands with soap and water after contact with the patch. °  ° °

## 2016-02-13 NOTE — Op Note (Signed)
Preoperative diagnosis: 4 mm left distal ureteral stone  Postoperative diagnosis same  Principal procedure: Cystoscopy, left retrograde ureteropyelogram with interpretive fluoroscopy, left ureteroscopic stone extraction  Surgeon: Karam Dunson  Anesthesia: Gen. with LMA  Complications: None  Estimated blood loss: None  Specimen: Stone, to the patient's husband  Indications: 40 year old female with persistent symptoms from a 4 mm left distal ureteral stone that has not passed with conservative/MET therapy. She presents at this time for left ureteroscopic stone extraction, with understanding of the procedure, risks and complications.  Description of procedure: The patient was identified in the holding area and properly marked. She received preoperative IV antibiotics. She was taken to the operating room where general anesthetic was administered with the LMA. She was placed in the dorsolithotomy position. Genitalia and perineum were prepped and draped. Proper timeout was performed.  A 23 French panendoscope was advanced in her bladder which was inspected circumferentially and found to be totally normal. Ureteral orifices were normal in configuration and location.  The left ureteral orifice was cannulated with a 6 [place] open-ended catheter. Retrograde ureteropyelogram was performed. This revealed a filling defect in the left distal ureter consistent with the previously mentioned 4 mm stone. The remaining ureter was normal, without evidence of filling defect or hydronephrosis. Pyelocalyceal system was also normal.  The 0.038 inch sensor-tip guidewire was then passed through the open-ended catheter, into the upper pole calyceal system using fluoroscopic guidance. The open-ended catheter and the cystoscope were removed. The distal ureter was then sequentially dilated, first with the inner core, then with the entire 12/14 French ureteral access catheter. Following this, the access catheter was removed,  the guidewire was left in. The 6 French dual-lumen semirigid ureteroscope was then advanced under direct vision into the bladder and then up into the left ureter. The stone was then at that point approximately 6-7 cm up the ureter. No other stones were encountered. The stone was grasped with the [place] basket and extracted quite easily. The ureteroscope was then advanced again into the ureter, no further abnormalities are stones were seen. The ureteroscope and the guidewire were removed.  At this point, the bladder was drained. The patient was then awakened and taken to the PACU in stable condition. She tolerated the procedure well.

## 2016-02-13 NOTE — H&P (Signed)
  H&P  Chief Complaint: Left sided kidney stone  History of Present Illness: [name] is a 40 y.o. year old female presenting for ureteroscopic management of a left distal ureteral calculus, 4 mm in sizze. She has failed conservative management w/ MET.  Past Medical History  Diagnosis Date  . Migraine   . Hx of varicella   . Endometriosis     surgery 2009  . Left ureteral stone   . PONV (postoperative nausea and vomiting)   . Borderline hypertension   . Frequency of urination   . Urgency of urination     Past Surgical History  Procedure Laterality Date  . Nasal septum surgery  02/ 2016  . Tonsillectomy  1985  . Laparoscopy lysis adhesions/  ablation and bx's of endometrial lesions/  right fimbroplasty  10-02-2007    Home Medications:  No prescriptions prior to admission    Allergies: No Known Allergies  Family History  Problem Relation Age of Onset  . Hypertension Mother   . Hypertension Father   . Breast cancer Mother     age 46   . Heart failure Mother     after breast cancer rx has icdalso     Social History:  reports that she has never smoked. She has never used smokeless tobacco. She reports that she drinks alcohol. Her drug history is not on file.  ROS: A complete review of systems was performed.  All systems are negative except for pertinent findings as noted.  Physical Exam:  Vital signs in last 24 hours:   General:  Alert and oriented, No acute distress HEENT: Normocephalic, atraumatic Neck: No JVD or lymphadenopathy Cardiovascular: Regular rate and rhythm Lungs: Clear bilaterally Abdomen: Soft, nontender, nondistended, no abdominal masses Back: No CVA tenderness Extremities: No edema Neurologic: Grossly intact  Laboratory Data:  No results found for this or any previous visit (from the past 24 hour(s)). No results found for this or any previous visit (from the past 240 hour(s)). Creatinine: No results for input(s): CREATININE in the last  168 hours.  Radiologic Imaging: No results found.  Impression/Assessment:  Persistent left distal ureteral stone  Plan:  Cystoscopy, left URS, extraction of left ureteral stone.  Jorja Loa 02/13/2016, 7:19 AM  Lillette Boxer. Uzziel Russey MD

## 2016-02-13 NOTE — Transfer of Care (Signed)
Immediate Anesthesia Transfer of Care Note  Patient: [name]  Procedure(s) Performed: Procedure(s): CYSTOSCOPY/RETROGRADE/URETEROSCOPY/STONE EXTRACTION WITH BASKET (Left)  Patient Location: PACU  Anesthesia Type:General  Level of Consciousness: awake and oriented  Airway & Oxygen Therapy: Patient Spontanous Breathing and Patient connected to nasal cannula oxygen  Post-op Assessment: Report given to RN  Post vital signs: Reviewed and stable  Last Vitals:  Filed Vitals:   02/13/16 1019 02/13/16 1240  BP: 136/95 148/81  Pulse: 65   Temp: 36.9 C 36.6 C  Resp: 16 14    Last Pain:  Filed Vitals:   02/13/16 1242  PainSc: Asleep      Patients Stated Pain Goal: 7 (02/13/16 1027)  Complications: No apparent anesthesia complications

## 2016-02-13 NOTE — Anesthesia Postprocedure Evaluation (Signed)
Anesthesia Post Note  Patient: [name]  Procedure(s) Performed: Procedure(s) (LRB): CYSTOSCOPY/RETROGRADE/URETEROSCOPY/STONE EXTRACTION WITH BASKET (Left)  Patient location during evaluation: PACU Anesthesia Type: General Level of consciousness: awake and alert Pain management: pain level controlled Vital Signs Assessment: post-procedure vital signs reviewed and stable Respiratory status: spontaneous breathing, nonlabored ventilation, respiratory function stable and patient connected to nasal cannula oxygen Cardiovascular status: blood pressure returned to baseline and stable Postop Assessment: no signs of nausea or vomiting Anesthetic complications: no    Last Vitals:  Filed Vitals:   02/13/16 1315 02/13/16 1359  BP: 140/78 132/76  Pulse: 71 67  Temp:  36.7 C  Resp: 14 12    Last Pain:  Filed Vitals:   02/13/16 1403  PainSc: Asleep                 Elaine Roanhorse J

## 2016-02-13 NOTE — Anesthesia Procedure Notes (Signed)
Procedure Name: LMA Insertion Date/Time: 02/13/2016 11:56 AM Performed by: Tyrone NineSAUVE, Athaliah Baumbach F Pre-anesthesia Checklist: Patient identified, Timeout performed, Emergency Drugs available, Suction available and Patient being monitored Patient Re-evaluated:Patient Re-evaluated prior to inductionOxygen Delivery Method: Circle system utilized Preoxygenation: Pre-oxygenation with 100% oxygen Intubation Type: IV induction Ventilation: Mask ventilation without difficulty LMA: LMA inserted LMA Size: 4.0 Number of attempts: 1 Placement Confirmation: positive ETCO2 and breath sounds checked- equal and bilateral Tube secured with: Tape Dental Injury: Teeth and Oropharynx as per pre-operative assessment

## 2016-02-19 ENCOUNTER — Encounter (HOSPITAL_BASED_OUTPATIENT_CLINIC_OR_DEPARTMENT_OTHER): Payer: Self-pay | Admitting: Urology

## 2016-02-20 DIAGNOSIS — N200 Calculus of kidney: Secondary | ICD-10-CM | POA: Diagnosis not present

## 2016-08-24 DIAGNOSIS — Z23 Encounter for immunization: Secondary | ICD-10-CM | POA: Diagnosis not present

## 2016-10-18 ENCOUNTER — Other Ambulatory Visit (INDEPENDENT_AMBULATORY_CARE_PROVIDER_SITE_OTHER): Payer: Self-pay

## 2016-10-18 DIAGNOSIS — Z0000 Encounter for general adult medical examination without abnormal findings: Secondary | ICD-10-CM

## 2016-10-18 LAB — BASIC METABOLIC PANEL
BUN: 17 mg/dL (ref 6–23)
CHLORIDE: 110 meq/L (ref 96–112)
CO2: 26 meq/L (ref 19–32)
CREATININE: 0.76 mg/dL (ref 0.40–1.20)
Calcium: 9.2 mg/dL (ref 8.4–10.5)
GFR: 89.14 mL/min (ref >60.00)
GLUCOSE: 82 mg/dL (ref 70–99)
POTASSIUM: 3.8 meq/L (ref 3.5–5.1)
Sodium: 142 mEq/L (ref 135–145)

## 2016-10-18 LAB — LIPID PANEL
CHOL/HDL RATIO: 3
CHOLESTEROL: 147 mg/dL (ref 0–200)
HDL: 48 mg/dL (ref >39.00)
LDL CALC: 89 mg/dL (ref 0–99)
NonHDL: 98.99
TRIGLYCERIDES: 51 mg/dL (ref 0.0–149.0)
VLDL: 10.2 mg/dL (ref 0.0–40.0)

## 2016-10-18 LAB — HEPATIC FUNCTION PANEL
ALT: 11 U/L (ref 0–35)
AST: 12 U/L (ref 0–37)
Albumin: 4.3 g/dL (ref 3.5–5.2)
Alkaline Phosphatase: 45 U/L (ref 39–117)
BILIRUBIN DIRECT: 0.1 mg/dL (ref 0.0–0.3)
BILIRUBIN TOTAL: 0.5 mg/dL (ref 0.2–1.2)
Total Protein: 6.6 g/dL (ref 6.0–8.3)

## 2016-10-18 LAB — CBC WITH DIFFERENTIAL/PLATELET
BASOS PCT: 0.3 % (ref 0.0–3.0)
Basophils Absolute: 0 10*3/uL (ref 0.0–0.1)
EOS ABS: 0.4 10*3/uL (ref 0.0–0.7)
EOS PCT: 9 % — AB (ref 0.0–5.0)
HCT: 39.4 % (ref 36.0–46.0)
HEMOGLOBIN: 13.3 g/dL (ref 12.0–15.0)
LYMPHS ABS: 1.1 10*3/uL (ref 0.7–4.0)
Lymphocytes Relative: 25.8 % (ref 12.0–46.0)
MCHC: 33.6 g/dL (ref 30.0–36.0)
MCV: 92.8 fl (ref 78.0–100.0)
MONO ABS: 0.4 10*3/uL (ref 0.1–1.0)
Monocytes Relative: 8.3 % (ref 3.0–12.0)
NEUTROS ABS: 2.4 10*3/uL (ref 1.4–7.7)
NEUTROS PCT: 56.6 % (ref 43.0–77.0)
PLATELETS: 192 10*3/uL (ref 150.0–400.0)
RBC: 4.25 Mil/uL (ref 3.87–5.11)
RDW: 13.4 % (ref 11.5–15.5)
WBC: 4.3 10*3/uL (ref 4.0–10.5)

## 2016-10-19 LAB — TSH: TSH: 1.22 u[IU]/mL (ref 0.35–4.50)

## 2016-10-22 NOTE — Progress Notes (Signed)
Chief Complaint  Patient presents with  . Annual Exam    HPI: Patient  [name]  41 y.o. comes in today for Preventive Health Care visit   bp monitor at times  Not recently .   Father has ht   sontes removed  No recurrance  Sees urology on a prn basis  Father had hx of renal stones  Snoring better since surgery on nose  Sleep 6-8 hours   HAs  Bad one a year ago  Now just ocass  Related to menses has been taking  zipso and zomig ( diclofenac product )   Health Maintenance  Topic Date Due  . HIV Screening  10/25/2017 (Originally 10/17/1990)  . PAP SMEAR  03/24/2018  . TETANUS/TDAP  11/19/2024  . INFLUENZA VACCINE  Addressed   Health Maintenance Review LIFESTYLE:  Exercise:  cv and weights  Tobacco/ETS:n Alcohol: ocass Sugar beverages: Sleep: 6-8  Drug use: no HH of 4 Work: 30 - 35 outside [employer]     ROS: see above  GEN/ HEENT: No fever, significant weight changes sweats headaches vision problems hearing changes, CV/ PULM; No chest pain shortness of breath cough, syncope,edema  change in exercise tolerance. GI /GU: No adominal pain, vomiting, change in bowel habits. No blood in the stool. No significant GU symptoms. SKIN/HEME: ,no acute skin rashes suspicious lesions or bleeding. No lymphadenopathy, nodules, masses.  NEURO/ PSYCH:  No neurologic signs such as weakness numbness. No depression anxiety. IMM/ Allergy: No unusual infections.  Allergy .   REST of 12 system review negative except as per HPI   Past Medical History:  Diagnosis Date  . Borderline hypertension   . Endometriosis    surgery 2009  . Frequency of urination   . Hx of varicella   . Left ureteral stone   . Migraine   . PONV (postoperative nausea and vomiting)   . Urgency of urination     Past Surgical History:  Procedure Laterality Date  . CYSTOSCOPY/RETROGRADE/URETEROSCOPY/STONE EXTRACTION WITH BASKET Left 02/13/2016   Procedure: CYSTOSCOPY/RETROGRADE/URETEROSCOPY/STONE EXTRACTION  WITH BASKET;  Surgeon: Franchot Gallo, MD;  Location: [facility];  Service: Urology;  Laterality: Left;  . LAPAROSCOPY LYSIS ADHESIONS/  ABLATION AND BX'S OF ENDOMETRIAL LESIONS/  RIGHT FIMBROPLASTY  10-02-2007  . NASAL SEPTUM SURGERY  02/ 2016  . TONSILLECTOMY  1985    Family History  Problem Relation Age of Onset  . Hypertension Mother   . Breast cancer Mother     age 29   . Heart failure Mother     after breast cancer rx has icdalso   . Hypertension Father   . Nephrolithiasis Father     Social History   Social History  . Marital status: Married    Spouse name: N/A  . Number of children: N/A  . Years of education: N/A   Social History Main Topics  . Smoking status: Never Smoker  . Smokeless tobacco: Never Used  . Alcohol use 0.0 oz/week     Comment: social  . Drug use: No  . Sexual activity: Yes    Partners: Male    Birth control/ protection: IUD     Comment: Mirena IUD placed 2016 approx   Other Topics Concern  . None   Social History Narrative   6-8 hours of sleep per night   Does not work outside home   Married for 12 years with 2 children   Children are 56yr and 5 yrs.   No  pets   etoh social    Hot yoga  And walking    g2 p2   Born raised  [place]   College degree [employer] [place]    Outpatient Medications Prior to Visit  Medication Sig Dispense Refill  . Biotin 5000 MCG CAPS Take 1 capsule by mouth daily.     . Diclofenac Potassium (ZIPSOR) 25 MG CAPS Take 1 every 8 hours as needed for migraine headache. 30 capsule 2  . ibuprofen (ADVIL,MOTRIN) 800 MG tablet Take 1 tablet (800 mg total) by mouth 3 (three) times daily. 21 tablet 0  . levonorgestrel (MIRENA) 20 MCG/24HR IUD by Intrauterine route.    . Multiple Vitamins-Minerals (HAIR VITAMINS) TABS Take 1 tablet by mouth daily.    . Naproxen Sodium (ALEVE) 220 MG CAPS Take by mouth as needed.    . Omega-3 Fatty Acids (FISH OIL) 1200 MG CAPS Take 1 capsule  by mouth daily.     . Probiotic Product (PROBIOTIC DAILY) CAPS Take 1 capsule by mouth daily.    Marland Kitchen ZOLMitriptan (ZOMIG) 2.5 MG tablet Take 1 tablet (2.5 mg total) by mouth as needed for migraine. 10 tablet 2  . oxyCODONE-acetaminophen (ROXICET) 5-325 MG tablet Take 1 tablet by mouth every 4 (four) hours as needed for severe pain. 10 tablet 0  . sulfamethoxazole-trimethoprim (BACTRIM DS,SEPTRA DS) 800-160 MG tablet Take 1 tablet by mouth 2 (two) times daily. 6 tablet 0   No facility-administered medications prior to visit.      EXAM:  BP 140/80 (BP Location: Left Arm, Patient Position: Sitting, Cuff Size: Normal)   Pulse 86   Temp 98.8 F (37.1 C) (Oral)   Ht 5' 3.5" (1.613 m)   Wt 125 lb 12.8 oz (57.1 kg)   BMI 21.93 kg/m   Body mass index is 21.93 kg/m. Wt Readings from Last 3 Encounters:  10/25/16 125 lb 12.8 oz (57.1 kg)  02/13/16 125 lb 8 oz (56.9 kg)  12/03/15 127 lb 6.4 oz (57.8 kg)    Physical Exam: Vital signs reviewed AQT:MAUQ is a well-developed well-nourished alert cooperative    who appearsr stated age in no acute distress.  HEENT: normocephalic atraumatic , Eyes: PERRL EOM's full, conjunctiva clear, Nares: paten,t no deformity discharge or tenderness., Ears: no deformity EAC's clear TMs with normal landmarks. Mouth: clear OP, no lesions, edema.  Moist mucous membranes. Dentition in adequate repair. NECK: supple without masses, thyromegaly or bruits. CHEST/PULM:  Clear to auscultation and percussion breath sounds equal no wheeze , rales or rhonchi. No chest wall deformities or tenderness. Breast: normal by inspection . No dimpling, discharge, masses, tenderness or discharge . CV: PMI is nondisplaced, S1 S2 no gallops, murmurs, rubs. Peripheral pulses are full without delay.No JVD .  ABDOMEN: Bowel sounds normal nontender  No guard or rebound, no hepato splenomegal no CVA tenderness.  No hernia. Extremtities:  No clubbing cyanosis or edema, no acute joint swelling or  redness no focal atrophy NEURO:  Oriented x3, cranial nerves 3-12 appear to be intact, no obvious focal weakness,gait within normal limits no abnormal reflexes or asymmetrical SKIN: No acute rashes normal turgor, color, no bruising or petechiae. PSYCH: Oriented, good eye contact, no obvious depression anxiety, cognition and judgment appear normal. LN: no cervical axillary inguinal adenopathy  Lab Results  Component Value Date   WBC 4.3 10/18/2016   HGB 13.3 10/18/2016   HCT 39.4 10/18/2016   PLT 192.0 10/18/2016   GLUCOSE 82 10/18/2016   CHOL  147 10/18/2016   TRIG 51.0 10/18/2016   HDL 48.00 10/18/2016   LDLCALC 89 10/18/2016   ALT 11 10/18/2016   AST 12 10/18/2016   NA 142 10/18/2016   K 3.8 10/18/2016   CL 110 10/18/2016   CREATININE 0.76 10/18/2016   BUN 17 10/18/2016   CO2 26 10/18/2016   TSH 1.22 10/18/2016    BP Readings from Last 3 Encounters:  10/25/16 140/80  02/13/16 132/76  12/03/15 110/80   Wt Readings from Last 3 Encounters:  10/25/16 125 lb 12.8 oz (57.1 kg)  02/13/16 125 lb 8 oz (56.9 kg)  12/03/15 127 lb 6.4 oz (57.8 kg)     Lab results reviewed with patient   ASSESSMENT AND PLAN:  Discussed the following assessment and plan:  Visit for preventive health examination  Family hx of hypertension  Family history of heart disease in female family member before age 28 Reviewed Pressure goals expectant management continue heart healthy get the salt out of any added salt out of her food and if blood pressure readings are increasing may add medication. Discussed with her genetic tendency. Encouraged to continue her heart healthy activity otherwise. Discussed mammogram may want to get regular at age 12 every year every other year but definitely by age 77. Migraine headaches. To be stable refill her Zomig would avoid diclofenac if possible because of cardiovascular risk.  instead take 2-2 and half Aleve at onset of headache with her Zomig  I do not think  the relative eos  Is clincally significant   Because absolutes are  Normal  Patient Care Team: Burnis Medin, MD as PCP - General (Internal Medicine) Vanessa Kick, MD as Consulting Physician (Obstetrics and Gynecology) Glennie Isle, PA-C (Physician Assistant) Patient Instructions  Take blood pressure readings twice a day for 7- 10 days and then   Every  3 -4 months  .To ensure below 140/90   Now goal 120/80    Dash eating  Is heart healthy.   If your BP  is not controlled contact us for fu .  Do not add extra sodium .      DASH Eating Plan DASH stands for "Dietary Approaches to Stop Hypertension." The DASH eating plan is a healthy eating plan that has been shown to reduce high blood pressure (hypertension). It may also reduce your risk for type 2 diabetes, heart disease, and stroke. The DASH eating plan may also help with weight loss. What are tips for following this plan? General guidelines   Avoid eating more than 2,300 mg (milligrams) of salt (sodium) a day. If you have hypertension, you may need to reduce your sodium intake to 1,500 mg a day.  Limit alcohol intake to no more than 1 drink a day for nonpregnant women and 2 drinks a day for men. One drink equals 12 oz of beer, 5 oz of wine, or 1 oz of hard liquor.  Work with your health care provider to maintain a healthy body weight or to lose weight. Ask what an ideal weight is for you.  Get at least 30 minutes of exercise that causes your heart to beat faster (aerobic exercise) most days of the week. Activities may include walking, swimming, or biking.  Work with your health care provider or diet and nutrition specialist (dietitian) to adjust your eating plan to your individual calorie needs. Reading food labels   Check food labels for the amount of sodium per serving. Choose foods with less than 5 percent  of the Daily Value of sodium. Generally, foods with less than 300 mg of sodium per serving fit into this eating plan.  To  find whole grains, look for the word "whole" as the first word in the ingredient list. Shopping   Buy products labeled as "low-sodium" or "no salt added."  Buy fresh foods. Avoid canned foods and premade or frozen meals. Cooking   Avoid adding salt when cooking. Use salt-free seasonings or herbs instead of table salt or sea salt. Check with your health care provider or pharmacist before using salt substitutes.  Do not fry foods. Cook foods using healthy methods such as baking, boiling, grilling, and broiling instead.  Cook with heart-healthy oils, such as olive, canola, soybean, or sunflower oil. Meal planning    Eat a balanced diet that includes:  5 or more servings of fruits and vegetables each day. At each meal, try to fill half of your plate with fruits and vegetables.  Up to 6-8 servings of whole grains each day.  Less than 6 oz of lean meat, poultry, or fish each day. A 3-oz serving of meat is about the same size as a deck of cards. One egg equals 1 oz.  2 servings of low-fat dairy each day.  A serving of nuts, seeds, or beans 5 times each week.  Heart-healthy fats. Healthy fats called Omega-3 fatty acids are found in foods such as flaxseeds and coldwater fish, like sardines, salmon, and mackerel.  Limit how much you eat of the following:  Canned or prepackaged foods.  Food that is high in trans fat, such as fried foods.  Food that is high in saturated fat, such as fatty meat.  Sweets, desserts, sugary drinks, and other foods with added sugar.  Full-fat dairy products.  Do not salt foods before eating.  Try to eat at least 2 vegetarian meals each week.  Eat more home-cooked food and less restaurant, buffet, and fast food.  When eating at a restaurant, ask that your food be prepared with less salt or no salt, if possible. What foods are recommended? The items listed may not be a complete list. Talk with your dietitian about what dietary choices are best for  you. Grains  Whole-grain or whole-wheat bread. Whole-grain or whole-wheat pasta. Brown rice. [place] Morrow. Bulgur. Whole-grain and low-sodium cereals. Pita bread. Low-fat, low-sodium crackers. Whole-wheat flour tortillas. Vegetables  Fresh or frozen vegetables (raw, steamed, roasted, or grilled). Low-sodium or reduced-sodium tomato and vegetable juice. Low-sodium or reduced-sodium tomato sauce and tomato paste. Low-sodium or reduced-sodium canned vegetables. Fruits  All fresh, dried, or frozen fruit. Canned fruit in natural juice (without added sugar). Meat and other protein foods  Skinless chicken or [place]. Ground chicken or [place]. Pork with fat trimmed off. Fish and seafood. Egg whites. Dried beans, peas, or lentils. Unsalted nuts, nut butters, and seeds. Unsalted canned beans. Lean cuts of beef with fat trimmed off. Low-sodium, lean deli meat. Dairy  Low-fat (1%) or fat-free (skim) milk. Fat-free, low-fat, or reduced-fat cheeses. Nonfat, low-sodium ricotta or cottage cheese. Low-fat or nonfat yogurt. Low-fat, low-sodium cheese. Fats and oils  Soft margarine without trans fats. Vegetable oil. Low-fat, reduced-fat, or light mayonnaise and salad dressings (reduced-sodium). Canola, safflower, olive, soybean, and sunflower oils. Avocado. Seasoning and other foods  Herbs. Spices. Seasoning mixes without salt. Unsalted popcorn and pretzels. Fat-free sweets. What foods are not recommended? The items listed may not be a complete list. Talk with your dietitian about what dietary choices are best for  you. Grains  Baked goods made with fat, such as croissants, muffins, or some breads. Dry pasta or rice meal packs. Vegetables  Creamed or fried vegetables. Vegetables in a cheese sauce. Regular canned vegetables (not low-sodium or reduced-sodium). Regular canned tomato sauce and paste (not low-sodium or reduced-sodium). Regular tomato and vegetable juice (not low-sodium or reduced-sodium). Angie Fava.  Olives. Fruits  Canned fruit in a light or heavy syrup. Fried fruit. Fruit in cream or butter sauce. Meat and other protein foods  Fatty cuts of meat. Ribs. Fried meat. Berniece Salines. Sausage. Bologna and other processed lunch meats. Salami. Fatback. Hotdogs. Bratwurst. Salted nuts and seeds. Canned beans with added salt. Canned or smoked fish. Whole eggs or egg yolks. Chicken or [place] with skin. Dairy  Whole or 2% milk, cream, and half-and-half. Whole or full-fat cream cheese. Whole-fat or sweetened yogurt. Full-fat cheese. Nondairy creamers. Whipped toppings. Processed cheese and cheese spreads. Fats and oils  Butter. Stick margarine. Lard. Shortening. Ghee. Bacon fat. Tropical oils, such as coconut, palm kernel, or palm oil. Seasoning and other foods  Salted popcorn and pretzels. Onion salt, garlic salt, seasoned salt, table salt, and sea salt. Worcestershire sauce. Tartar sauce. Barbecue sauce. Teriyaki sauce. Soy sauce, including reduced-sodium. Steak sauce. Canned and packaged gravies. Fish sauce. Oyster sauce. Cocktail sauce. Horseradish that you find on the shelf. Ketchup. Mustard. Meat flavorings and tenderizers. Bouillon cubes. Hot sauce and Tabasco sauce. Premade or packaged marinades. Premade or packaged taco seasonings. Relishes. Regular salad dressings. Where to find more information:  National Heart, Lung, and [place]: https://wilson-eaton.com/  American Heart Association: www.heart.org Summary  The DASH eating plan is a healthy eating plan that has been shown to reduce high blood pressure (hypertension). It may also reduce your risk for type 2 diabetes, heart disease, and stroke.  With the DASH eating plan, you should limit salt (sodium) intake to 2,300 mg a day. If you have hypertension, you may need to reduce your sodium intake to 1,500 mg a day.  When on the DASH eating plan, aim to eat more fresh fruits and vegetables, whole grains, lean proteins, low-fat dairy, and heart-healthy  fats.  Work with your health care provider or diet and nutrition specialist (dietitian) to adjust your eating plan to your individual calorie needs. This information is not intended to replace advice given to you by your health care provider. Make sure you discuss any questions you have with your health care provider. Document Released: 07/15/2011 Document Revised: 07/19/2016 Document Reviewed: 07/19/2016 Elsevier Interactive Patient Education  2017 [place] Maintenance, Female Adopting a healthy lifestyle and getting preventive care can go a long way to promote health and wellness. Talk with your health care provider about what schedule of regular examinations is right for you. This is a good chance for you to check in with your provider about disease prevention and staying healthy. In between checkups, there are plenty of things you can do on your own. Experts have done a lot of research about which lifestyle changes and preventive measures are most likely to keep you healthy. Ask your health care provider for more information. Weight and diet Eat a healthy diet  Be sure to include plenty of vegetables, fruits, low-fat dairy products, and lean protein.  Do not eat a lot of foods high in solid fats, added sugars, or salt.  Get regular exercise. This is one of the most important things you can do for your health.  Most adults should exercise for at  least 150 minutes each week. The exercise should increase your heart rate and make you sweat (moderate-intensity exercise).  Most adults should also do strengthening exercises at least twice a week. This is in addition to the moderate-intensity exercise. Maintain a healthy weight  Body mass index (BMI) is a measurement that can be used to identify possible weight problems. It estimates body fat based on height and weight. Your health care provider can help determine your BMI and help you achieve or maintain a healthy weight.  For  females 79 years of age and older:  A BMI below 18.5 is considered underweight.  A BMI of 18.5 to 24.9 is normal.  A BMI of 25 to 29.9 is considered overweight.  A BMI of 30 and above is considered obese. Watch levels of cholesterol and blood lipids  You should start having your blood tested for lipids and cholesterol at 41 years of age, then have this test every 5 years.  You may need to have your cholesterol levels checked more often if:  Your lipid or cholesterol levels are high.  You are older than 41 years of age.  You are at high risk for heart disease. Cancer screening Lung Cancer  Lung cancer screening is recommended for adults 67-59 years old who are at high risk for lung cancer because of a history of smoking.  A yearly low-dose CT scan of the lungs is recommended for people who:  Currently smoke.  Have quit within the past 15 years.  Have at least a 30-pack-year history of smoking. A pack year is smoking an average of one pack of cigarettes a day for 1 year.  Yearly screening should continue until it has been 15 years since you quit.  Yearly screening should stop if you develop a health problem that would prevent you from having lung cancer treatment. Breast Cancer  Practice breast self-awareness. This means understanding how your breasts normally appear and feel.  It also means doing regular breast self-exams. Let your health care provider know about any changes, no matter how small.  If you are in your 20s or 30s, you should have a clinical breast exam (CBE) by a health care provider every 1-3 years as part of a regular health exam.  If you are 48 or older, have a CBE every year. Also consider having a breast X-ray (mammogram) every year.  If you have a family history of breast cancer, talk to your health care provider about genetic screening.  If you are at high risk for breast cancer, talk to your health care provider about having an MRI and a mammogram  every year.  Breast cancer gene (BRCA) assessment is recommended for women who have family members with BRCA-related cancers. BRCA-related cancers include:  Breast.  Ovarian.  Tubal.  Peritoneal cancers.  Results of the assessment will determine the need for genetic counseling and BRCA1 and BRCA2 testing. Cervical Cancer  Your health care provider may recommend that you be screened regularly for cancer of the pelvic organs (ovaries, uterus, and vagina). This screening involves a pelvic examination, including checking for microscopic changes to the surface of your cervix (Pap test). You may be encouraged to have this screening done every 3 years, beginning at age 14.  For women ages 24-65, health care providers may recommend pelvic exams and Pap testing every 3 years, or they may recommend the Pap and pelvic exam, combined with testing for human papilloma virus (HPV), every 5 years. Some types of HPV  increase your risk of cervical cancer. Testing for HPV may also be done on women of any age with unclear Pap test results.  Other health care providers may not recommend any screening for nonpregnant women who are considered low risk for pelvic cancer and who do not have symptoms. Ask your health care provider if a screening pelvic exam is right for you.  If you have had past treatment for cervical cancer or a condition that could lead to cancer, you need Pap tests and screening for cancer for at least 20 years after your treatment. If Pap tests have been discontinued, your risk factors (such as having a new sexual partner) need to be reassessed to determine if screening should resume. Some women have medical problems that increase the chance of getting cervical cancer. In these cases, your health care provider may recommend more frequent screening and Pap tests. Colorectal Cancer  This type of cancer can be detected and often prevented.  Routine colorectal cancer screening usually begins at 41  years of age and continues through 41 years of age.  Your health care provider may recommend screening at an earlier age if you have risk factors for colon cancer.  Your health care provider may also recommend using home test kits to check for hidden blood in the stool.  A small camera at the end of a tube can be used to examine your colon directly (sigmoidoscopy or colonoscopy). This is done to check for the earliest forms of colorectal cancer.  Routine screening usually begins at age 43.  Direct examination of the colon should be repeated every 5-10 years through 41 years of age. However, you may need to be screened more often if early forms of precancerous polyps or small growths are found. Skin Cancer  Check your skin from head to toe regularly.  Tell your health care provider about any new moles or changes in moles, especially if there is a change in a mole's shape or color.  Also tell your health care provider if you have a mole that is larger than the size of a pencil eraser.  Always use sunscreen. Apply sunscreen liberally and repeatedly throughout the day.  Protect yourself by wearing long sleeves, pants, a wide-brimmed hat, and sunglasses whenever you are outside. Heart disease, diabetes, and high blood pressure  High blood pressure causes heart disease and increases the risk of stroke. High blood pressure is more likely to develop in:  People who have blood pressure in the high end of the normal range (130-139/85-89 mm Hg).  People who are overweight or obese.  People who are African American.  If you are 22-71 years of age, have your blood pressure checked every 3-5 years. If you are 31 years of age or older, have your blood pressure checked every year. You should have your blood pressure measured twice-once when you are at a hospital or clinic, and once when you are not at a hospital or clinic. Record the average of the two measurements. To check your blood pressure when  you are not at a hospital or clinic, you can use:  An automated blood pressure machine at a pharmacy.  A home blood pressure monitor.  If you are between 68 years and 74 years old, ask your health care provider if you should take aspirin to prevent strokes.  Have regular diabetes screenings. This involves taking a blood sample to check your fasting blood sugar level.  If you are at a normal weight and  have a low risk for diabetes, have this test once every three years after 41 years of age.  If you are overweight and have a high risk for diabetes, consider being tested at a younger age or more often. Preventing infection Hepatitis B  If you have a higher risk for hepatitis B, you should be screened for this virus. You are considered at high risk for hepatitis B if:  You were born in a country where hepatitis B is common. Ask your health care provider which countries are considered high risk.  Your parents were born in a high-risk country, and you have not been immunized against hepatitis B (hepatitis B vaccine).  You have HIV or AIDS.  You use needles to inject street drugs.  You live with someone who has hepatitis B.  You have had sex with someone who has hepatitis B.  You get hemodialysis treatment.  You take certain medicines for conditions, including cancer, organ transplantation, and autoimmune conditions. Hepatitis C  Blood testing is recommended for:  Everyone born from [birth year] through 1965.  Anyone with known risk factors for hepatitis C. Sexually transmitted infections (STIs)  You should be screened for sexually transmitted infections (STIs) including gonorrhea and chlamydia if:  You are sexually active and are younger than 41 years of age.  You are older than 41 years of age and your health care provider tells you that you are at risk for this type of infection.  Your sexual activity has changed since you were last screened and you are at an increased risk for  chlamydia or gonorrhea. Ask your health care provider if you are at risk.  If you do not have HIV, but are at risk, it may be recommended that you take a prescription medicine daily to prevent HIV infection. This is called pre-exposure prophylaxis (PrEP). You are considered at risk if:  You are sexually active and do not regularly use condoms or know the HIV status of your partner(s).  You take drugs by injection.  You are sexually active with a partner who has HIV. Talk with your health care provider about whether you are at high risk of being infected with HIV. If you choose to begin PrEP, you should first be tested for HIV. You should then be tested every 3 months for as long as you are taking PrEP. Pregnancy  If you are premenopausal and you may become pregnant, ask your health care provider about preconception counseling.  If you may become pregnant, take 400 to 800 micrograms (mcg) of folic acid every day.  If you want to prevent pregnancy, talk to your health care provider about birth control (contraception). Osteoporosis and menopause  Osteoporosis is a disease in which the bones lose minerals and strength with aging. This can result in serious bone fractures. Your risk for osteoporosis can be identified using a bone density scan.  If you are 79 years of age or older, or if you are at risk for osteoporosis and fractures, ask your health care provider if you should be screened.  Ask your health care provider whether you should take a calcium or vitamin D supplement to lower your risk for osteoporosis.  Menopause may have certain physical symptoms and risks.  Hormone replacement therapy may reduce some of these symptoms and risks. Talk to your health care provider about whether hormone replacement therapy is right for you. Follow these instructions at home:  Schedule regular health, dental, and eye exams.  Stay current with your  immunizations.  Do not use any tobacco products  including cigarettes, chewing tobacco, or electronic cigarettes.  If you are pregnant, do not drink alcohol.  If you are breastfeeding, limit how much and how often you drink alcohol.  Limit alcohol intake to no more than 1 drink per day for nonpregnant women. One drink equals 12 ounces of beer, 5 ounces of wine, or 1 ounces of hard liquor.  Do not use street drugs.  Do not share needles.  Ask your health care provider for help if you need support or information about quitting drugs.  Tell your health care provider if you often feel depressed.  Tell your health care provider if you have ever been abused or do not feel safe at home. This information is not intended to replace advice given to you by your health care provider. Make sure you discuss any questions you have with your health care provider. Document Released: 02/08/2011 Document Revised: 01/01/2016 Document Reviewed: 04/29/2015 Elsevier Interactive Patient Education  2017 [place] K. Panosh M.D.

## 2016-10-25 ENCOUNTER — Ambulatory Visit (INDEPENDENT_AMBULATORY_CARE_PROVIDER_SITE_OTHER): Payer: BLUE CROSS/BLUE SHIELD | Admitting: Internal Medicine

## 2016-10-25 ENCOUNTER — Encounter: Payer: Self-pay | Admitting: Internal Medicine

## 2016-10-25 VITALS — BP 140/80 | HR 86 | Temp 98.8°F | Ht 63.5 in | Wt 125.8 lb

## 2016-10-25 DIAGNOSIS — Z0000 Encounter for general adult medical examination without abnormal findings: Secondary | ICD-10-CM | POA: Diagnosis not present

## 2016-10-25 DIAGNOSIS — Z8249 Family history of ischemic heart disease and other diseases of the circulatory system: Secondary | ICD-10-CM

## 2016-10-25 MED ORDER — ZOLMITRIPTAN 2.5 MG PO TABS: 2.5000 mg | ORAL_TABLET | ORAL | 2 refills | Status: DC | PRN

## 2016-10-25 NOTE — Patient Instructions (Addendum)
Take blood pressure readings twice a day for 7- 10 days and then   Every  3 -4 months  .To ensure below 140/90   Now goal 120/80    Dash eating  Is heart healthy.   If your BP  is not controlled contact us for fu .  Do not add extra sodium .      DASH Eating Plan DASH stands for "Dietary Approaches to Stop Hypertension." The DASH eating plan is a healthy eating plan that has been shown to reduce high blood pressure (hypertension). It may also reduce your risk for type 2 diabetes, heart disease, and stroke. The DASH eating plan may also help with weight loss. What are tips for following this plan? General guidelines   Avoid eating more than 2,300 mg (milligrams) of salt (sodium) a day. If you have hypertension, you may need to reduce your sodium intake to 1,500 mg a day.  Limit alcohol intake to no more than 1 drink a day for nonpregnant women and 2 drinks a day for men. One drink equals 12 oz of beer, 5 oz of wine, or 1 oz of hard liquor.  Work with your health care provider to maintain a healthy body weight or to lose weight. Ask what an ideal weight is for you.  Get at least 30 minutes of exercise that causes your heart to beat faster (aerobic exercise) most days of the week. Activities may include walking, swimming, or biking.  Work with your health care provider or diet and nutrition specialist (dietitian) to adjust your eating plan to your individual calorie needs. Reading food labels   Check food labels for the amount of sodium per serving. Choose foods with less than 5 percent of the Daily Value of sodium. Generally, foods with less than 300 mg of sodium per serving fit into this eating plan.  To find whole grains, look for the word "whole" as the first word in the ingredient list. Shopping   Buy products labeled as "low-sodium" or "no salt added."  Buy fresh foods. Avoid canned foods and premade or frozen meals. Cooking   Avoid adding salt when cooking. Use salt-free  seasonings or herbs instead of table salt or sea salt. Check with your health care provider or pharmacist before using salt substitutes.  Do not fry foods. Cook foods using healthy methods such as baking, boiling, grilling, and broiling instead.  Cook with heart-healthy oils, such as olive, canola, soybean, or sunflower oil. Meal planning    Eat a balanced diet that includes:  5 or more servings of fruits and vegetables each day. At each meal, try to fill half of your plate with fruits and vegetables.  Up to 6-8 servings of whole grains each day.  Less than 6 oz of lean meat, poultry, or fish each day. A 3-oz serving of meat is about the same size as a deck of cards. One egg equals 1 oz.  2 servings of low-fat dairy each day.  A serving of nuts, seeds, or beans 5 times each week.  Heart-healthy fats. Healthy fats called Omega-3 fatty acids are found in foods such as flaxseeds and coldwater fish, like sardines, salmon, and mackerel.  Limit how much you eat of the following:  Canned or prepackaged foods.  Food that is high in trans fat, such as fried foods.  Food that is high in saturated fat, such as fatty meat.  Sweets, desserts, sugary drinks, and other foods with added sugar.  Full-fat dairy  products.  Do not salt foods before eating.  Try to eat at least 2 vegetarian meals each week.  Eat more home-cooked food and less restaurant, buffet, and fast food.  When eating at a restaurant, ask that your food be prepared with less salt or no salt, if possible. What foods are recommended? The items listed may not be a complete list. Talk with your dietitian about what dietary choices are best for you. Grains  Whole-grain or whole-wheat bread. Whole-grain or whole-wheat pasta. Brown rice. [place]. Bulgur. Whole-grain and low-sodium cereals. Pita bread. Low-fat, low-sodium crackers. Whole-wheat flour tortillas. Vegetables  Fresh or frozen vegetables (raw, steamed,  roasted, or grilled). Low-sodium or reduced-sodium tomato and vegetable juice. Low-sodium or reduced-sodium tomato sauce and tomato paste. Low-sodium or reduced-sodium canned vegetables. Fruits  All fresh, dried, or frozen fruit. Canned fruit in natural juice (without added sugar). Meat and other protein foods  Skinless chicken or [place]. Ground chicken or [place]. Pork with fat trimmed off. Fish and seafood. Egg whites. Dried beans, peas, or lentils. Unsalted nuts, nut butters, and seeds. Unsalted canned beans. Lean cuts of beef with fat trimmed off. Low-sodium, lean deli meat. Dairy  Low-fat (1%) or fat-free (skim) milk. Fat-free, low-fat, or reduced-fat cheeses. Nonfat, low-sodium ricotta or cottage cheese. Low-fat or nonfat yogurt. Low-fat, low-sodium cheese. Fats and oils  Soft margarine without trans fats. Vegetable oil. Low-fat, reduced-fat, or light mayonnaise and salad dressings (reduced-sodium). Canola, safflower, olive, soybean, and sunflower oils. Avocado. Seasoning and other foods  Herbs. Spices. Seasoning mixes without salt. Unsalted popcorn and pretzels. Fat-free sweets. What foods are not recommended? The items listed may not be a complete list. Talk with your dietitian about what dietary choices are best for you. Grains  Baked goods made with fat, such as croissants, muffins, or some breads. Dry pasta or rice meal packs. Vegetables  Creamed or fried vegetables. Vegetables in a cheese sauce. Regular canned vegetables (not low-sodium or reduced-sodium). Regular canned tomato sauce and paste (not low-sodium or reduced-sodium). Regular tomato and vegetable juice (not low-sodium or reduced-sodium). Angie Fava. Olives. Fruits  Canned fruit in a light or heavy syrup. Fried fruit. Fruit in cream or butter sauce. Meat and other protein foods  Fatty cuts of meat. Ribs. Fried meat. Berniece Salines. Sausage. Bologna and other processed lunch meats. Salami. Fatback. Hotdogs. Bratwurst. Salted nuts and  seeds. Canned beans with added salt. Canned or smoked fish. Whole eggs or egg yolks. Chicken or [place] with skin. Dairy  Whole or 2% milk, cream, and half-and-half. Whole or full-fat cream cheese. Whole-fat or sweetened yogurt. Full-fat cheese. Nondairy creamers. Whipped toppings. Processed cheese and cheese spreads. Fats and oils  Butter. Stick margarine. Lard. Shortening. Ghee. Bacon fat. Tropical oils, such as coconut, palm kernel, or palm oil. Seasoning and other foods  Salted popcorn and pretzels. Onion salt, garlic salt, seasoned salt, table salt, and sea salt. Worcestershire sauce. Tartar sauce. Barbecue sauce. Teriyaki sauce. Soy sauce, including reduced-sodium. Steak sauce. Canned and packaged gravies. Fish sauce. Oyster sauce. Cocktail sauce. Horseradish that you find on the shelf. Ketchup. Mustard. Meat flavorings and tenderizers. Bouillon cubes. Hot sauce and Tabasco sauce. Premade or packaged marinades. Premade or packaged taco seasonings. Relishes. Regular salad dressings. Where to find more information:  National Heart, Lung, and [place]: https://wilson-eaton.com/  American Heart Association: www.heart.org Summary  The DASH eating plan is a healthy eating plan that has been shown to reduce high blood pressure (hypertension). It may also reduce your risk for type 2  diabetes, heart disease, and stroke.  With the DASH eating plan, you should limit salt (sodium) intake to 2,300 mg a day. If you have hypertension, you may need to reduce your sodium intake to 1,500 mg a day.  When on the DASH eating plan, aim to eat more fresh fruits and vegetables, whole grains, lean proteins, low-fat dairy, and heart-healthy fats.  Work with your health care provider or diet and nutrition specialist (dietitian) to adjust your eating plan to your individual calorie needs. This information is not intended to replace advice given to you by your health care provider. Make sure you discuss any questions  you have with your health care provider. Document Released: 07/15/2011 Document Revised: 07/19/2016 Document Reviewed: 07/19/2016 Elsevier Interactive Patient Education  2017 [place] Maintenance, Female Adopting a healthy lifestyle and getting preventive care can go a long way to promote health and wellness. Talk with your health care provider about what schedule of regular examinations is right for you. This is a good chance for you to check in with your provider about disease prevention and staying healthy. In between checkups, there are plenty of things you can do on your own. Experts have done a lot of research about which lifestyle changes and preventive measures are most likely to keep you healthy. Ask your health care provider for more information. Weight and diet Eat a healthy diet  Be sure to include plenty of vegetables, fruits, low-fat dairy products, and lean protein.  Do not eat a lot of foods high in solid fats, added sugars, or salt.  Get regular exercise. This is one of the most important things you can do for your health.  Most adults should exercise for at least 150 minutes each week. The exercise should increase your heart rate and make you sweat (moderate-intensity exercise).  Most adults should also do strengthening exercises at least twice a week. This is in addition to the moderate-intensity exercise. Maintain a healthy weight  Body mass index (BMI) is a measurement that can be used to identify possible weight problems. It estimates body fat based on height and weight. Your health care provider can help determine your BMI and help you achieve or maintain a healthy weight.  For females 22 years of age and older:  A BMI below 18.5 is considered underweight.  A BMI of 18.5 to 24.9 is normal.  A BMI of 25 to 29.9 is considered overweight.  A BMI of 30 and above is considered obese. Watch levels of cholesterol and blood lipids  You should start  having your blood tested for lipids and cholesterol at 41 years of age, then have this test every 5 years.  You may need to have your cholesterol levels checked more often if:  Your lipid or cholesterol levels are high.  You are older than 41 years of age.  You are at high risk for heart disease. Cancer screening Lung Cancer  Lung cancer screening is recommended for adults 96-57 years old who are at high risk for lung cancer because of a history of smoking.  A yearly low-dose CT scan of the lungs is recommended for people who:  Currently smoke.  Have quit within the past 15 years.  Have at least a 30-pack-year history of smoking. A pack year is smoking an average of one pack of cigarettes a day for 1 year.  Yearly screening should continue until it has been 15 years since you quit.  Yearly screening should stop  if you develop a health problem that would prevent you from having lung cancer treatment. Breast Cancer  Practice breast self-awareness. This means understanding how your breasts normally appear and feel.  It also means doing regular breast self-exams. Let your health care provider know about any changes, no matter how small.  If you are in your 20s or 30s, you should have a clinical breast exam (CBE) by a health care provider every 1-3 years as part of a regular health exam.  If you are 13 or older, have a CBE every year. Also consider having a breast X-ray (mammogram) every year.  If you have a family history of breast cancer, talk to your health care provider about genetic screening.  If you are at high risk for breast cancer, talk to your health care provider about having an MRI and a mammogram every year.  Breast cancer gene (BRCA) assessment is recommended for women who have family members with BRCA-related cancers. BRCA-related cancers include:  Breast.  Ovarian.  Tubal.  Peritoneal cancers.  Results of the assessment will determine the need for genetic  counseling and BRCA1 and BRCA2 testing. Cervical Cancer  Your health care provider may recommend that you be screened regularly for cancer of the pelvic organs (ovaries, uterus, and vagina). This screening involves a pelvic examination, including checking for microscopic changes to the surface of your cervix (Pap test). You may be encouraged to have this screening done every 3 years, beginning at age 8.  For women ages 42-65, health care providers may recommend pelvic exams and Pap testing every 3 years, or they may recommend the Pap and pelvic exam, combined with testing for human papilloma virus (HPV), every 5 years. Some types of HPV increase your risk of cervical cancer. Testing for HPV may also be done on women of any age with unclear Pap test results.  Other health care providers may not recommend any screening for nonpregnant women who are considered low risk for pelvic cancer and who do not have symptoms. Ask your health care provider if a screening pelvic exam is right for you.  If you have had past treatment for cervical cancer or a condition that could lead to cancer, you need Pap tests and screening for cancer for at least 20 years after your treatment. If Pap tests have been discontinued, your risk factors (such as having a new sexual partner) need to be reassessed to determine if screening should resume. Some women have medical problems that increase the chance of getting cervical cancer. In these cases, your health care provider may recommend more frequent screening and Pap tests. Colorectal Cancer  This type of cancer can be detected and often prevented.  Routine colorectal cancer screening usually begins at 41 years of age and continues through 41 years of age.  Your health care provider may recommend screening at an earlier age if you have risk factors for colon cancer.  Your health care provider may also recommend using home test kits to check for hidden blood in the stool.  A  small camera at the end of a tube can be used to examine your colon directly (sigmoidoscopy or colonoscopy). This is done to check for the earliest forms of colorectal cancer.  Routine screening usually begins at age 30.  Direct examination of the colon should be repeated every 5-10 years through 41 years of age. However, you may need to be screened more often if early forms of precancerous polyps or small growths are found.  Skin Cancer  Check your skin from head to toe regularly.  Tell your health care provider about any new moles or changes in moles, especially if there is a change in a mole's shape or color.  Also tell your health care provider if you have a mole that is larger than the size of a pencil eraser.  Always use sunscreen. Apply sunscreen liberally and repeatedly throughout the day.  Protect yourself by wearing long sleeves, pants, a wide-brimmed hat, and sunglasses whenever you are outside. Heart disease, diabetes, and high blood pressure  High blood pressure causes heart disease and increases the risk of stroke. High blood pressure is more likely to develop in:  People who have blood pressure in the high end of the normal range (130-139/85-89 mm Hg).  People who are overweight or obese.  People who are African American.  If you are 63-72 years of age, have your blood pressure checked every 3-5 years. If you are 4 years of age or older, have your blood pressure checked every year. You should have your blood pressure measured twice-once when you are at a hospital or clinic, and once when you are not at a hospital or clinic. Record the average of the two measurements. To check your blood pressure when you are not at a hospital or clinic, you can use:  An automated blood pressure machine at a pharmacy.  A home blood pressure monitor.  If you are between 62 years and 43 years old, ask your health care provider if you should take aspirin to prevent strokes.  Have regular  diabetes screenings. This involves taking a blood sample to check your fasting blood sugar level.  If you are at a normal weight and have a low risk for diabetes, have this test once every three years after 41 years of age.  If you are overweight and have a high risk for diabetes, consider being tested at a younger age or more often. Preventing infection Hepatitis B  If you have a higher risk for hepatitis B, you should be screened for this virus. You are considered at high risk for hepatitis B if:  You were born in a country where hepatitis B is common. Ask your health care provider which countries are considered high risk.  Your parents were born in a high-risk country, and you have not been immunized against hepatitis B (hepatitis B vaccine).  You have HIV or AIDS.  You use needles to inject street drugs.  You live with someone who has hepatitis B.  You have had sex with someone who has hepatitis B.  You get hemodialysis treatment.  You take certain medicines for conditions, including cancer, organ transplantation, and autoimmune conditions. Hepatitis C  Blood testing is recommended for:  Everyone born from [birth year] through 1965.  Anyone with known risk factors for hepatitis C. Sexually transmitted infections (STIs)  You should be screened for sexually transmitted infections (STIs) including gonorrhea and chlamydia if:  You are sexually active and are younger than 41 years of age.  You are older than 41 years of age and your health care provider tells you that you are at risk for this type of infection.  Your sexual activity has changed since you were last screened and you are at an increased risk for chlamydia or gonorrhea. Ask your health care provider if you are at risk.  If you do not have HIV, but are at risk, it may be recommended that you take a prescription medicine  daily to prevent HIV infection. This is called pre-exposure prophylaxis (PrEP). You are considered at  risk if:  You are sexually active and do not regularly use condoms or know the HIV status of your partner(s).  You take drugs by injection.  You are sexually active with a partner who has HIV. Talk with your health care provider about whether you are at high risk of being infected with HIV. If you choose to begin PrEP, you should first be tested for HIV. You should then be tested every 3 months for as long as you are taking PrEP. Pregnancy  If you are premenopausal and you may become pregnant, ask your health care provider about preconception counseling.  If you may become pregnant, take 400 to 800 micrograms (mcg) of folic acid every day.  If you want to prevent pregnancy, talk to your health care provider about birth control (contraception). Osteoporosis and menopause  Osteoporosis is a disease in which the bones lose minerals and strength with aging. This can result in serious bone fractures. Your risk for osteoporosis can be identified using a bone density scan.  If you are 81 years of age or older, or if you are at risk for osteoporosis and fractures, ask your health care provider if you should be screened.  Ask your health care provider whether you should take a calcium or vitamin D supplement to lower your risk for osteoporosis.  Menopause may have certain physical symptoms and risks.  Hormone replacement therapy may reduce some of these symptoms and risks. Talk to your health care provider about whether hormone replacement therapy is right for you. Follow these instructions at home:  Schedule regular health, dental, and eye exams.  Stay current with your immunizations.  Do not use any tobacco products including cigarettes, chewing tobacco, or electronic cigarettes.  If you are pregnant, do not drink alcohol.  If you are breastfeeding, limit how much and how often you drink alcohol.  Limit alcohol intake to no more than 1 drink per day for nonpregnant women. One drink  equals 12 ounces of beer, 5 ounces of wine, or 1 ounces of hard liquor.  Do not use street drugs.  Do not share needles.  Ask your health care provider for help if you need support or information about quitting drugs.  Tell your health care provider if you often feel depressed.  Tell your health care provider if you have ever been abused or do not feel safe at home. This information is not intended to replace advice given to you by your health care provider. Make sure you discuss any questions you have with your health care provider. Document Released: 02/08/2011 Document Revised: 01/01/2016 Document Reviewed: 04/29/2015 Elsevier Interactive Patient Education  2017 [employer].

## 2016-12-01 DIAGNOSIS — J309 Allergic rhinitis, unspecified: Secondary | ICD-10-CM | POA: Diagnosis not present

## 2016-12-01 DIAGNOSIS — R21 Rash and other nonspecific skin eruption: Secondary | ICD-10-CM | POA: Diagnosis not present

## 2016-12-06 ENCOUNTER — Ambulatory Visit (INDEPENDENT_AMBULATORY_CARE_PROVIDER_SITE_OTHER): Payer: BLUE CROSS/BLUE SHIELD | Admitting: Family Medicine

## 2016-12-06 ENCOUNTER — Encounter: Payer: Self-pay | Admitting: Family Medicine

## 2016-12-06 VITALS — BP 110/80 | HR 79 | Temp 99.1°F | Ht 63.5 in | Wt 123.4 lb

## 2016-12-06 DIAGNOSIS — M533 Sacrococcygeal disorders, not elsewhere classified: Secondary | ICD-10-CM

## 2016-12-06 DIAGNOSIS — R222 Localized swelling, mass and lump, trunk: Secondary | ICD-10-CM

## 2016-12-06 NOTE — Progress Notes (Signed)
HPI:  Acute visit for bump on the bottom. Reports has been there for about 1-2 months. She notices it when she does yoga with certain positions that involve pressure in this region. Not really painful, but uncomfortable. Denies fevers, malaise, redness, warmth, swelling or drainage. Denies lesions elsewhere.  ROS: See pertinent positives and negatives per HPI.  Past Medical History:  Diagnosis Date  . Borderline hypertension   . Endometriosis    surgery 2009  . Frequency of urination   . Hx of varicella   . Left ureteral stone   . Migraine   . PONV (postoperative nausea and vomiting)   . Urgency of urination     Past Surgical History:  Procedure Laterality Date  . CYSTOSCOPY/RETROGRADE/URETEROSCOPY/STONE EXTRACTION WITH BASKET Left 02/13/2016   Procedure: CYSTOSCOPY/RETROGRADE/URETEROSCOPY/STONE EXTRACTION WITH BASKET;  Surgeon: Marcine Matar, MD;  Location: [facility];  Service: Urology;  Laterality: Left;  . LAPAROSCOPY LYSIS ADHESIONS/  ABLATION AND BX'S OF ENDOMETRIAL LESIONS/  RIGHT FIMBROPLASTY  10-02-2007  . NASAL SEPTUM SURGERY  02/ 2016  . TONSILLECTOMY  1985    Family History  Problem Relation Age of Onset  . Hypertension Mother   . Breast cancer Mother     age 3   . Heart failure Mother     after breast cancer rx has icdalso   . Hypertension Father   . Nephrolithiasis Father     Social History   Social History  . Marital status: Married    Spouse name: N/A  . Number of children: N/A  . Years of education: N/A   Social History Main Topics  . Smoking status: Never Smoker  . Smokeless tobacco: Never Used  . Alcohol use 0.0 oz/week     Comment: social  . Drug use: No  . Sexual activity: Yes    Partners: Male    Birth control/ protection: IUD     Comment: Mirena IUD placed 2016 approx   Other Topics Concern  . None   Social History Narrative   6-8 hours of sleep per night   Does not work outside home   Married for 12 years  with 2 children   Children are 39yrs and 5 yrs.   No pets   etoh social    Hot yoga  And walking    g2 p2   Born raised  [place]   College degree [employer] [place]     Current Outpatient Prescriptions:  .  Biotin 5000 MCG CAPS, Take 1 capsule by mouth daily. , Disp: , Rfl:  .  Diclofenac Potassium (ZIPSOR) 25 MG CAPS, Take 1 every 8 hours as needed for migraine headache., Disp: 30 capsule, Rfl: 2 .  ibuprofen (ADVIL,MOTRIN) 800 MG tablet, Take 1 tablet (800 mg total) by mouth 3 (three) times daily., Disp: 21 tablet, Rfl: 0 .  levonorgestrel (MIRENA) 20 MCG/24HR IUD, by Intrauterine route., Disp: , Rfl:  .  Multiple Vitamins-Minerals (HAIR VITAMINS) TABS, Take 1 tablet by mouth daily., Disp: , Rfl:  .  Naproxen Sodium (ALEVE) 220 MG CAPS, Take by mouth as needed., Disp: , Rfl:  .  Omega-3 Fatty Acids (FISH OIL) 1200 MG CAPS, Take 1 capsule by mouth daily. , Disp: , Rfl:  .  Probiotic Product (PROBIOTIC DAILY) CAPS, Take 1 capsule by mouth daily., Disp: , Rfl:  .  ZOLMitriptan (ZOMIG) 2.5 MG tablet, Take 1 tablet (2.5 mg total) by mouth as needed for migraine., Disp: 10 tablet, Rfl: 2  EXAM:  Vitals:   12/06/16 1023  BP: 110/80  Pulse: 79  Temp: 99.1 F (37.3 C)    Body mass index is 21.52 kg/m.  GENERAL: vitals reviewed and listed above, alert, oriented, appears well hydrated and in no acute distress  HEENT: atraumatic, conjunttiva clear, no obvious abnormalities on inspection of external nose and ears  SKIN: soft dome shaped sub cut lesions R upper lat gluteal cleft region with palpable underlying firmer cylindrical structure that extends deeper, no erythema/warmth/appreciable poor/drainage  MS: moves all extremities without noticeable abnormality  PSYCH: pleasant and cooperative, no obvious depression or anxiety  ASSESSMENT AND PLAN:  Discussed the following assessment and plan:  Mass of sacrum - Plan: Ambulatory referral to General  Surgery  -we discussed possible serious and likely etiologies, workup and treatment, treatment risks and return precautions - ? Pilonidal tract or cyst vs other, no signs inflammation or infection at this time -after this discussion, Truda opted for evaluation with surgery as she desire removal -follow up advised as needed here in the interim -of course, we advised Yovanna  to return or notify a doctor immediately if symptoms worsen or persist or new concerns arise.    Patient Instructions  -We placed a referral for you as discussed to the general surgeon. It usually takes about 1-2 weeks to process and schedule this referral. If you have not heard from [place] regarding this appointment in 2 weeks please contact our office.  -please return to clinic if worsening, redness, drainage or other concerns in the interim.    Kriste Basque R., DO \

## 2016-12-06 NOTE — Patient Instructions (Signed)
-  We placed a referral for you as discussed to the general surgeon. It usually takes about 1-2 weeks to process and schedule this referral. If you have not heard from [place] regarding this appointment in 2 weeks please contact our office.  -please return to clinic if worsening, redness, drainage or other concerns in the interim.

## 2016-12-06 NOTE — Progress Notes (Signed)
Pre visit review using our clinic review tool, if applicable. No additional management support is needed unless otherwise documented below in the visit note. 

## 2016-12-08 ENCOUNTER — Other Ambulatory Visit: Payer: Self-pay | Admitting: Obstetrics and Gynecology

## 2016-12-08 DIAGNOSIS — Z124 Encounter for screening for malignant neoplasm of cervix: Secondary | ICD-10-CM | POA: Diagnosis not present

## 2016-12-08 DIAGNOSIS — Z1231 Encounter for screening mammogram for malignant neoplasm of breast: Secondary | ICD-10-CM | POA: Diagnosis not present

## 2016-12-08 DIAGNOSIS — Z01419 Encounter for gynecological examination (general) (routine) without abnormal findings: Secondary | ICD-10-CM | POA: Diagnosis not present

## 2016-12-08 DIAGNOSIS — Z6821 Body mass index (BMI) 21.0-21.9, adult: Secondary | ICD-10-CM | POA: Diagnosis not present

## 2016-12-10 LAB — CYTOLOGY - PAP

## 2016-12-23 DIAGNOSIS — L218 Other seborrheic dermatitis: Secondary | ICD-10-CM | POA: Diagnosis not present

## 2017-01-05 DIAGNOSIS — R229 Localized swelling, mass and lump, unspecified: Secondary | ICD-10-CM | POA: Diagnosis not present

## 2017-01-06 DIAGNOSIS — M5441 Lumbago with sciatica, right side: Secondary | ICD-10-CM | POA: Diagnosis not present

## 2017-01-06 DIAGNOSIS — M9905 Segmental and somatic dysfunction of pelvic region: Secondary | ICD-10-CM | POA: Diagnosis not present

## 2017-01-06 DIAGNOSIS — M9903 Segmental and somatic dysfunction of lumbar region: Secondary | ICD-10-CM | POA: Diagnosis not present

## 2017-01-06 DIAGNOSIS — M9904 Segmental and somatic dysfunction of sacral region: Secondary | ICD-10-CM | POA: Diagnosis not present

## 2017-02-15 DIAGNOSIS — M545 Low back pain: Secondary | ICD-10-CM | POA: Diagnosis not present

## 2017-02-15 DIAGNOSIS — R030 Elevated blood-pressure reading, without diagnosis of hypertension: Secondary | ICD-10-CM | POA: Diagnosis not present

## 2017-02-17 ENCOUNTER — Other Ambulatory Visit: Payer: Self-pay | Admitting: Neurological Surgery

## 2017-02-17 DIAGNOSIS — M545 Low back pain: Secondary | ICD-10-CM

## 2017-03-02 ENCOUNTER — Inpatient Hospital Stay
Admission: RE | Admit: 2017-03-02 | Discharge: 2017-03-02 | Disposition: A | Discharge status: Home / Self Care | Payer: BLUE CROSS/BLUE SHIELD | Source: Ambulatory Visit | Attending: Neurological Surgery | Admitting: Neurological Surgery

## 2017-04-29 ENCOUNTER — Encounter: Payer: Self-pay | Admitting: Internal Medicine

## 2017-05-26 DIAGNOSIS — L309 Dermatitis, unspecified: Secondary | ICD-10-CM | POA: Diagnosis not present

## 2017-05-26 DIAGNOSIS — D179 Benign lipomatous neoplasm, unspecified: Secondary | ICD-10-CM | POA: Diagnosis not present

## 2017-10-17 ENCOUNTER — Telehealth: Payer: Self-pay | Admitting: Internal Medicine

## 2017-10-17 NOTE — Telephone Encounter (Signed)
Mrs. [name] phoned today stating she, feels lightheaded and legs feel week at times. She also reports she works out regularly and sometimes  Feels she's  not getting deep breaths in. From the diet she describes, sounds like a strict low cal, maybe low sugar diet all the time. She is traveling out of the country later this week for vacation and didn't want to go without seeing a Dr. PCP is Dr. Fabian SharpPanosh and she does have a CPE  With her later this month.

## 2017-10-18 ENCOUNTER — Ambulatory Visit: Payer: BLUE CROSS/BLUE SHIELD | Admitting: Family Medicine

## 2017-10-18 ENCOUNTER — Encounter: Payer: Self-pay | Admitting: Family Medicine

## 2017-10-18 VITALS — BP 120/70 | HR 60 | Temp 98.5°F | Wt 122.2 lb

## 2017-10-18 DIAGNOSIS — G8929 Other chronic pain: Secondary | ICD-10-CM | POA: Diagnosis not present

## 2017-10-18 DIAGNOSIS — R002 Palpitations: Secondary | ICD-10-CM | POA: Diagnosis not present

## 2017-10-18 DIAGNOSIS — M5441 Lumbago with sciatica, right side: Secondary | ICD-10-CM | POA: Diagnosis not present

## 2017-10-18 DIAGNOSIS — R0602 Shortness of breath: Secondary | ICD-10-CM | POA: Diagnosis not present

## 2017-10-18 NOTE — Progress Notes (Signed)
   Subjective:    Patient ID: [name], female    DOB: [birth year], 42 y.o.   MRN: [id number]  HPI Here complaining of worsening pain in the lower back which radiates down the posterior right thigh. This started over 10 years ago but has been worse the past month or so. She had been using Ibuprofen or Aleve but these caused some GERD issues. She now use Tylenol, and this gives her some relief. No numbness or weakness in the legs. No hx of back trauma. She also describes occasional SOB or palpitations in the chest for several months. She thinks these may be related to anxiety. These almost always occur at times of rest, and they often occur while driving her car. She exercises regularly, including hot yoga, and she never has these symptoms during exercise.    Review of Systems  Constitutional: Negative.   Respiratory: Positive for shortness of breath. Negative for cough and wheezing.   Cardiovascular: Positive for palpitations. Negative for chest pain and leg swelling.  Gastrointestinal: Negative.   Genitourinary: Negative.   Musculoskeletal: Positive for back pain.  Neurological: Negative.        Objective:   Physical Exam  Constitutional: She is oriented to person, place, and time. She appears well-nourished. No distress.  Neck: No thyromegaly present.  Cardiovascular: Normal rate, regular rhythm, normal heart sounds and intact distal pulses.  Pulmonary/Chest: Effort normal and breath sounds normal. No respiratory distress. She has no wheezes. She has no rales.  Musculoskeletal:  The lower back is normal on exam, no tenderness or spasm, ROM is full. Negative SLR. She is tender over the right sciatic notch   Lymphadenopathy:    She has no cervical adenopathy.  Neurological: She is alert and oriented to person, place, and time.          Assessment & Plan:  She has long standing low back pain now with right sided sciatica, so we will set up an MRI of the lumbar spine. She will  use Tylenol prn. Her SOB and chest flutters are almost certainly related to anxiety, and she agrees this is possible. She does not want to treat the anxiety at this point however.  Gershon CraneStephen Cortnie Ringel, MD

## 2017-10-31 NOTE — Progress Notes (Signed)
Chief Complaint  Patient presents with  . Annual Exam    no new concerns    HPI: Patient  [name]  42 y.o. comes in today for Preventive Health Care visi ocass headaches .   zomig helps  monlty at most with menses  Gets yearly mammo   PER [place] for now to get mri has een spine specialist  Stays active  Health Maintenance  Topic Date Due  . HIV Screening  10/17/1990  . INFLUENZA VACCINE  03/09/2017  . PAP SMEAR  12/09/2019  . TETANUS/TDAP  11/19/2024   Health Maintenance Review LIFESTYLE:  Exercise:   3 x per week and some  Tobacco/ETS:  no Alcohol:  social Sugar beverages: no Sleep: 7- 8  Drug use: no HH of   4 no pets  Work:  20 - 40     ROS:  ocass sob with  Anxiety?  Pain  Not today no part with exercise  GEN/ HEENT: No fever, significant weight changes sweats headaches vision problems hearing changes, CV/ PULM; No chest pain shortness of breath cough, syncope,edema  change in exercise tolerance. GI /GU: No adominal pain, vomiting, change in bowel habits. No blood in the stool. No significant GU symptoms. SKIN/HEME: ,no acute skin rashes suspicious lesions or bleeding. No lymphadenopathy, nodules, masses.  NEURO/ PSYCH:  No neurologic signs such as weakness numbness. No depression anxiety. IMM/ Allergy: No unusual infections.  Allergy .   REST of 12 system review negative except as per HPI   Past Medical History:  Diagnosis Date  . Borderline hypertension   . Endometriosis    surgery 2009  . Frequency of urination   . Hx of varicella   . Left ureteral stone   . Migraine   . PONV (postoperative nausea and vomiting)   . Urgency of urination     Past Surgical History:  Procedure Laterality Date  . CYSTOSCOPY/RETROGRADE/URETEROSCOPY/STONE EXTRACTION WITH BASKET Left 02/13/2016   Procedure: CYSTOSCOPY/RETROGRADE/URETEROSCOPY/STONE EXTRACTION WITH BASKET;  Surgeon: Franchot Gallo, MD;  Location: [facility];   Service: Urology;  Laterality: Left;  . LAPAROSCOPY LYSIS ADHESIONS/  ABLATION AND BX'S OF ENDOMETRIAL LESIONS/  RIGHT FIMBROPLASTY  10-02-2007  . NASAL SEPTUM SURGERY  02/ 2016  . TONSILLECTOMY  1985    Family History  Problem Relation Age of Onset  . Hypertension Mother   . Breast cancer Mother        age 9   . Heart failure Mother        after breast cancer rx has icdalso   . Hypertension Father   . Nephrolithiasis Father     Social History   Socioeconomic History  . Marital status: Married    Spouse name: Not on file  . Number of children: Not on file  . Years of education: Not on file  . Highest education level: Not on file  Occupational History  . Not on file  Social Needs  . Financial resource strain: Not on file  . Food insecurity:    Worry: Not on file    Inability: Not on file  . Transportation needs:    Medical: Not on file    Non-medical: Not on file  Tobacco Use  . Smoking status: Never Smoker  . Smokeless tobacco: Never Used  Substance and Sexual Activity  . Alcohol use: Yes    Alcohol/week: 0.0 oz    Comment: social  . Drug use: No  . Sexual activity:  Yes    Partners: Male    Birth control/protection: IUD    Comment: Mirena IUD placed 2016 approx  Lifestyle  . Physical activity:    Days per week: Not on file    Minutes per session: Not on file  . Stress: Not on file  Relationships  . Social connections:    Talks on phone: Not on file    Gets together: Not on file    Attends religious service: Not on file    Active member of club or organization: Not on file    Attends meetings of clubs or organizations: Not on file    Relationship status: Not on file  Other Topics Concern  . Not on file  Social History Narrative   6-8 hours of sleep per night   Does not work outside home   Married for 12 years with 2 children   Children are 61yr and 5 yrs.   No pets   etoh social    Hot yoga  And walking    g2 p2   Born raised  [place]   [place]   Outpatient Medications Prior to Visit  Medication Sig Dispense Refill  . Biotin 5000 MCG CAPS Take 1 capsule by mouth daily.     . Diclofenac Potassium (ZIPSOR) 25 MG CAPS Take 1 every 8 hours as needed for migraine headache. 30 capsule 2  . levonorgestrel (MIRENA) 20 MCG/24HR IUD by Intrauterine route.    . Multiple Vitamins-Minerals (HAIR VITAMINS) TABS Take 1 tablet by mouth daily.    . Naproxen Sodium (ALEVE) 220 MG CAPS Take by mouth as needed.    . Omega-3 Fatty Acids (FISH OIL) 1200 MG CAPS Take 1 capsule by mouth daily.     . Probiotic Product (PROBIOTIC DAILY) CAPS Take 1 capsule by mouth daily.    .Marland KitchenZOLMitriptan (ZOMIG) 2.5 MG tablet Take 1 tablet (2.5 mg total) by mouth as needed for migraine. 10 tablet 2   No facility-administered medications prior to visit.      EXAM:  BP 112/78 (BP Location: Right Arm, Patient Position: Sitting, Cuff Size: Normal)   Pulse 74   Temp 98.7 F (37.1 C) (Oral)   Ht 5' 3.25" (1.607 m)   Wt 123 lb 6.4 oz (56 kg)   BMI 21.69 kg/m   Body mass index is 21.69 kg/m. Wt Readings from Last 3 Encounters:  11/01/17 123 lb 6.4 oz (56 kg)  10/18/17 122 lb 3.2 oz (55.4 kg)  12/06/16 123 lb 6.4 oz (56 kg)    Physical Exam: Vital signs reviewed [id number]:AJOIis a well-developed well-nourished alert cooperative    who appearsr stated age in no acute distress.  HEENT: normocephalic atraumatic , Eyes: PERRL EOM's full, conjunctiva clear, Nares: paten,t no deformity discharge or tenderness., Ears: no deformity EAC's clear TMs with normal landmarks. Mouth: clear OP, no lesions, edema.  Moist mucous membranes. Dentition in adequate repair. NECK: supple without masses, thyromegaly or bruits. CHEST/PULM:  Clear to auscultation and percussion breath sounds equal no wheeze , rales or rhonchi. No chest wall deformities or tenderness. Breast: normal by inspection . No dimpling, discharge, masses, tenderness  or discharge . CV: PMI is nondisplaced, S1 S2 no gallops, murmurs, rubs. Peripheral pulses are full without delay.No JVD .  ABDOMEN: Bowel sounds normal nontender  No guard or rebound, no hepato splenomegal no CVA tenderness.  No hernia. Extremtities:  No clubbing cyanosis or edema, no acute  joint swelling or redness no focal atrophy NEURO:  Oriented x3, cranial nerves 3-12 appear to be intact, no obvious focal weakness,gait within normal limits no abnormal reflexes or asymmetrical SKIN: No acute rashes normal turgor, color, no bruising or petechiae. PSYCH: Oriented, good eye contact, no obvious depression anxiety, cognition and judgment appear normal. LN: no cervical axillary inguinal adenopathy  Lab Results  Component Value Date   WBC 4.3 10/18/2016   HGB 13.3 10/18/2016   HCT 39.4 10/18/2016   PLT 192.0 10/18/2016   GLUCOSE 82 10/18/2016   CHOL 147 10/18/2016   TRIG 51.0 10/18/2016   HDL 48.00 10/18/2016   LDLCALC 89 10/18/2016   ALT 11 10/18/2016   AST 12 10/18/2016   NA 142 10/18/2016   K 3.8 10/18/2016   CL 110 10/18/2016   CREATININE 0.76 10/18/2016   BUN 17 10/18/2016   CO2 26 10/18/2016   TSH 1.22 10/18/2016    BP Readings from Last 3 Encounters:  11/01/17 112/78  10/18/17 120/70  12/06/16 110/80      ASSESSMENT AND PLAN:  Discussed the following assessment and plan:  Visit for preventive health examination - Plan: Basic metabolic panel, CBC with Differential/Platelet, Hepatic function panel, Lipid panel, TSH  Family hx of hypertension - Plan: Basic metabolic panel, CBC with Differential/Platelet, Hepatic function panel, Lipid panel, TSH  Encounter for screening for lipid disorder - Plan: Basic metabolic panel, CBC with Differential/Platelet, Hepatic function panel, Lipid panel, TSH  Screening for diabetes mellitus - Plan: Basic metabolic panel, CBC with Differential/Platelet, Hepatic function panel, Lipid panel, TSH  Family history of heart disease in  female family member before age 77 - Plan: Basic metabolic panel, CBC with Differential/Platelet, Hepatic function panel, Lipid panel, TSH   Colon at 50 vs other screening .  Fu if bp rising and needs interventions  oir as needed  Patient Care Team: Panosh, Standley Brooking, MD as PCP - General (Internal Medicine) Vanessa Kick, MD as Consulting Physician (Obstetrics and Gynecology) Glennie Isle, PA-C (Physician Assistant) Patient Instructions  Will notify you  of labs when available.   bp goal 120/80     Health Maintenance, Female Adopting a healthy lifestyle and getting preventive care can go a long way to promote health and wellness. Talk with your health care provider about what schedule of regular examinations is right for you. This is a good chance for you to check in with your provider about disease prevention and staying healthy. In between checkups, there are plenty of things you can do on your own. Experts have done a lot of research about which lifestyle changes and preventive measures are most likely to keep you healthy. Ask your health care provider for more information. Weight and diet Eat a healthy diet  Be sure to include plenty of vegetables, fruits, low-fat dairy products, and lean protein.  Do not eat a lot of foods high in solid fats, added sugars, or salt.  Get regular exercise. This is one of the most important things you can do for your health. ? Most adults should exercise for at least 150 minutes each week. The exercise should increase your heart rate and make you sweat (moderate-intensity exercise). ? Most adults should also do strengthening exercises at least twice a week. This is in addition to the moderate-intensity exercise.  Maintain a healthy weight  Body mass index (BMI) is a measurement that can be used to identify possible weight problems. It estimates body fat based on height and weight.  Your health care provider can help determine your BMI and help you  achieve or maintain a healthy weight.  For females 41 years of age and older: ? A BMI below 18.5 is considered underweight. ? A BMI of 18.5 to 24.9 is normal. ? A BMI of 25 to 29.9 is considered overweight. ? A BMI of 30 and above is considered obese.  Watch levels of cholesterol and blood lipids  You should start having your blood tested for lipids and cholesterol at 42 years of age, then have this test every 5 years.  You may need to have your cholesterol levels checked more often if: ? Your lipid or cholesterol levels are high. ? You are older than 42 years of age. ? You are at high risk for heart disease.  Cancer screening Lung Cancer  Lung cancer screening is recommended for adults 47-65 years old who are at high risk for lung cancer because of a history of smoking.  A yearly low-dose CT scan of the lungs is recommended for people who: ? Currently smoke. ? Have quit within the past 15 years. ? Have at least a 30-pack-year history of smoking. A pack year is smoking an average of one pack of cigarettes a day for 1 year.  Yearly screening should continue until it has been 15 years since you quit.  Yearly screening should stop if you develop a health problem that would prevent you from having lung cancer treatment.  Breast Cancer  Practice breast self-awareness. This means understanding how your breasts normally appear and feel.  It also means doing regular breast self-exams. Let your health care provider know about any changes, no matter how small.  If you are in your 20s or 30s, you should have a clinical breast exam (CBE) by a health care provider every 1-3 years as part of a regular health exam.  If you are 71 or older, have a CBE every year. Also consider having a breast X-ray (mammogram) every year.  If you have a family history of breast cancer, talk to your health care provider about genetic screening.  If you are at high risk for breast cancer, talk to your health  care provider about having an MRI and a mammogram every year.  Breast cancer gene (BRCA) assessment is recommended for women who have family members with BRCA-related cancers. BRCA-related cancers include: ? Breast. ? Ovarian. ? Tubal. ? Peritoneal cancers.  Results of the assessment will determine the need for genetic counseling and BRCA1 and BRCA2 testing.  Cervical Cancer Your health care provider may recommend that you be screened regularly for cancer of the pelvic organs (ovaries, uterus, and vagina). This screening involves a pelvic examination, including checking for microscopic changes to the surface of your cervix (Pap test). You may be encouraged to have this screening done every 3 years, beginning at age 37.  For women ages 47-65, health care providers may recommend pelvic exams and Pap testing every 3 years, or they may recommend the Pap and pelvic exam, combined with testing for human papilloma virus (HPV), every 5 years. Some types of HPV increase your risk of cervical cancer. Testing for HPV may also be done on women of any age with unclear Pap test results.  Other health care providers may not recommend any screening for nonpregnant women who are considered low risk for pelvic cancer and who do not have symptoms. Ask your health care provider if a screening pelvic exam is right for you.  If  you have had past treatment for cervical cancer or a condition that could lead to cancer, you need Pap tests and screening for cancer for at least 20 years after your treatment. If Pap tests have been discontinued, your risk factors (such as having a new sexual partner) need to be reassessed to determine if screening should resume. Some women have medical problems that increase the chance of getting cervical cancer. In these cases, your health care provider may recommend more frequent screening and Pap tests.  Colorectal Cancer  This type of cancer can be detected and often  prevented.  Routine colorectal cancer screening usually begins at 42 years of age and continues through 42 years of age.  Your health care provider may recommend screening at an earlier age if you have risk factors for colon cancer.  Your health care provider may also recommend using home test kits to check for hidden blood in the stool.  A small camera at the end of a tube can be used to examine your colon directly (sigmoidoscopy or colonoscopy). This is done to check for the earliest forms of colorectal cancer.  Routine screening usually begins at age 5.  Direct examination of the colon should be repeated every 5-10 years through 42 years of age. However, you may need to be screened more often if early forms of precancerous polyps or small growths are found.  Skin Cancer  Check your skin from head to toe regularly.  Tell your health care provider about any new moles or changes in moles, especially if there is a change in a mole's shape or color.  Also tell your health care provider if you have a mole that is larger than the size of a pencil eraser.  Always use sunscreen. Apply sunscreen liberally and repeatedly throughout the day.  Protect yourself by wearing long sleeves, pants, a wide-brimmed hat, and sunglasses whenever you are outside.  Heart disease, diabetes, and high blood pressure  High blood pressure causes heart disease and increases the risk of stroke. High blood pressure is more likely to develop in: ? People who have blood pressure in the high end of the normal range (130-139/85-89 mm Hg). ? People who are overweight or obese. ? People who are African American.  If you are 39-42 years of age, have your blood pressure checked every 3-5 years. If you are 66 years of age or older, have your blood pressure checked every year. You should have your blood pressure measured twice-once when you are at a hospital or clinic, and once when you are not at a hospital or clinic.  Record the average of the two measurements. To check your blood pressure when you are not at a hospital or clinic, you can use: ? An automated blood pressure machine at a pharmacy. ? A home blood pressure monitor.  If you are between 28 years and 18 years old, ask your health care provider if you should take aspirin to prevent strokes.  Have regular diabetes screenings. This involves taking a blood sample to check your fasting blood sugar level. ? If you are at a normal weight and have a low risk for diabetes, have this test once every three years after 42 years of age. ? If you are overweight and have a high risk for diabetes, consider being tested at a younger age or more often. Preventing infection Hepatitis B  If you have a higher risk for hepatitis B, you should be screened for this virus. You are  considered at high risk for hepatitis B if: ? You were born in a country where hepatitis B is common. Ask your health care provider which countries are considered high risk. ? Your parents were born in a high-risk country, and you have not been immunized against hepatitis B (hepatitis B vaccine). ? You have HIV or AIDS. ? You use needles to inject street drugs. ? You live with someone who has hepatitis B. ? You have had sex with someone who has hepatitis B. ? You get hemodialysis treatment. ? You take certain medicines for conditions, including cancer, organ transplantation, and autoimmune conditions.  Hepatitis C  Blood testing is recommended for: ? Everyone born from [birth year] through 1965. ? Anyone with known risk factors for hepatitis C.  Sexually transmitted infections (STIs)  You should be screened for sexually transmitted infections (STIs) including gonorrhea and chlamydia if: ? You are sexually active and are younger than 42 years of age. ? You are older than 42 years of age and your health care provider tells you that you are at risk for this type of infection. ? Your sexual  activity has changed since you were last screened and you are at an increased risk for chlamydia or gonorrhea. Ask your health care provider if you are at risk.  If you do not have HIV, but are at risk, it may be recommended that you take a prescription medicine daily to prevent HIV infection. This is called pre-exposure prophylaxis (PrEP). You are considered at risk if: ? You are sexually active and do not regularly use condoms or know the HIV status of your partner(s). ? You take drugs by injection. ? You are sexually active with a partner who has HIV.  Talk with your health care provider about whether you are at high risk of being infected with HIV. If you choose to begin PrEP, you should first be tested for HIV. You should then be tested every 3 months for as long as you are taking PrEP. Pregnancy  If you are premenopausal and you may become pregnant, ask your health care provider about preconception counseling.  If you may become pregnant, take 400 to 800 micrograms (mcg) of folic acid every day.  If you want to prevent pregnancy, talk to your health care provider about birth control (contraception). Osteoporosis and menopause  Osteoporosis is a disease in which the bones lose minerals and strength with aging. This can result in serious bone fractures. Your risk for osteoporosis can be identified using a bone density scan.  If you are 61 years of age or older, or if you are at risk for osteoporosis and fractures, ask your health care provider if you should be screened.  Ask your health care provider whether you should take a calcium or vitamin D supplement to lower your risk for osteoporosis.  Menopause may have certain physical symptoms and risks.  Hormone replacement therapy may reduce some of these symptoms and risks. Talk to your health care provider about whether hormone replacement therapy is right for you. Follow these instructions at home:  Schedule regular health, dental,  and eye exams.  Stay current with your immunizations.  Do not use any tobacco products including cigarettes, chewing tobacco, or electronic cigarettes.  If you are pregnant, do not drink alcohol.  If you are breastfeeding, limit how much and how often you drink alcohol.  Limit alcohol intake to no more than 1 drink per day for nonpregnant women. One drink equals 12 ounces of  beer, 5 ounces of wine, or 1 ounces of hard liquor.  Do not use street drugs.  Do not share needles.  Ask your health care provider for help if you need support or information about quitting drugs.  Tell your health care provider if you often feel depressed.  Tell your health care provider if you have ever been abused or do not feel safe at home. This information is not intended to replace advice given to you by your health care provider. Make sure you discuss any questions you have with your health care provider. Document Released: 02/08/2011 Document Revised: 01/01/2016 Document Reviewed: 04/29/2015 Elsevier Interactive Patient Education  2018 [place]. Panosh M.D.

## 2017-11-01 ENCOUNTER — Ambulatory Visit (INDEPENDENT_AMBULATORY_CARE_PROVIDER_SITE_OTHER): Payer: BLUE CROSS/BLUE SHIELD | Admitting: Internal Medicine

## 2017-11-01 ENCOUNTER — Encounter: Payer: Self-pay | Admitting: Internal Medicine

## 2017-11-01 VITALS — BP 112/78 | HR 74 | Temp 98.7°F | Ht 63.25 in | Wt 123.4 lb

## 2017-11-01 DIAGNOSIS — Z13220 Encounter for screening for lipoid disorders: Secondary | ICD-10-CM

## 2017-11-01 DIAGNOSIS — Z8249 Family history of ischemic heart disease and other diseases of the circulatory system: Secondary | ICD-10-CM

## 2017-11-01 DIAGNOSIS — Z0000 Encounter for general adult medical examination without abnormal findings: Secondary | ICD-10-CM

## 2017-11-01 DIAGNOSIS — Z131 Encounter for screening for diabetes mellitus: Secondary | ICD-10-CM

## 2017-11-01 LAB — BASIC METABOLIC PANEL
BUN: 17 mg/dL (ref 6–23)
CALCIUM: 9.3 mg/dL (ref 8.4–10.5)
CHLORIDE: 107 meq/L (ref 96–112)
CO2: 24 meq/L (ref 19–32)
CREATININE: 0.8 mg/dL (ref 0.40–1.20)
GFR: 83.59 mL/min (ref >60.00)
Glucose, Bld: 88 mg/dL (ref 70–99)
Potassium: 3.8 mEq/L (ref 3.5–5.1)
Sodium: 139 mEq/L (ref 135–145)

## 2017-11-01 LAB — CBC WITH DIFFERENTIAL/PLATELET
BASOS ABS: 0 10*3/uL (ref 0.0–0.1)
BASOS PCT: 0.4 % (ref 0.0–3.0)
EOS ABS: 0.4 10*3/uL (ref 0.0–0.7)
Eosinophils Relative: 7 % — ABNORMAL HIGH (ref 0.0–5.0)
HEMATOCRIT: 39.7 % (ref 36.0–46.0)
Hemoglobin: 13.5 g/dL (ref 12.0–15.0)
LYMPHS PCT: 22.9 % (ref 12.0–46.0)
Lymphs Abs: 1.2 10*3/uL (ref 0.7–4.0)
MCHC: 34 g/dL (ref 30.0–36.0)
MCV: 91.9 fl (ref 78.0–100.0)
Monocytes Absolute: 0.4 10*3/uL (ref 0.1–1.0)
Monocytes Relative: 8.7 % (ref 3.0–12.0)
Neutro Abs: 3.1 10*3/uL (ref 1.4–7.7)
Neutrophils Relative %: 61 % (ref 43.0–77.0)
PLATELETS: 175 10*3/uL (ref 150.0–400.0)
RBC: 4.32 Mil/uL (ref 3.87–5.11)
RDW: 12.8 % (ref 11.5–15.5)
WBC: 5 10*3/uL (ref 4.0–10.5)

## 2017-11-01 LAB — LIPID PANEL
CHOL/HDL RATIO: 3
Cholesterol: 144 mg/dL (ref 0–200)
HDL: 52.8 mg/dL (ref >39.00)
LDL Cholesterol: 81 mg/dL (ref 0–99)
NonHDL: 91.43
TRIGLYCERIDES: 51 mg/dL (ref 0.0–149.0)
VLDL: 10.2 mg/dL (ref 0.0–40.0)

## 2017-11-01 LAB — HEPATIC FUNCTION PANEL
ALBUMIN: 4.5 g/dL (ref 3.5–5.2)
ALT: 11 U/L (ref 0–35)
AST: 14 U/L (ref 0–37)
Alkaline Phosphatase: 50 U/L (ref 39–117)
BILIRUBIN DIRECT: 0.2 mg/dL (ref 0.0–0.3)
TOTAL PROTEIN: 7.2 g/dL (ref 6.0–8.3)
Total Bilirubin: 0.7 mg/dL (ref 0.2–1.2)

## 2017-11-01 LAB — TSH: TSH: 1.53 u[IU]/mL (ref 0.35–4.50)

## 2017-11-01 NOTE — Patient Instructions (Addendum)
Will notify you  of labs when available.   bp goal 120/80     Health Maintenance, Female Adopting a healthy lifestyle and getting preventive care can go a long way to promote health and wellness. Talk with your health care provider about what schedule of regular examinations is right for you. This is a good chance for you to check in with your provider about disease prevention and staying healthy. In between checkups, there are plenty of things you can do on your own. Experts have done a lot of research about which lifestyle changes and preventive measures are most likely to keep you healthy. Ask your health care provider for more information. Weight and diet Eat a healthy diet  Be sure to include plenty of vegetables, fruits, low-fat dairy products, and lean protein.  Do not eat a lot of foods high in solid fats, added sugars, or salt.  Get regular exercise. This is one of the most important things you can do for your health. ? Most adults should exercise for at least 150 minutes each week. The exercise should increase your heart rate and make you sweat (moderate-intensity exercise). ? Most adults should also do strengthening exercises at least twice a week. This is in addition to the moderate-intensity exercise.  Maintain a healthy weight  Body mass index (BMI) is a measurement that can be used to identify possible weight problems. It estimates body fat based on height and weight. Your health care provider can help determine your BMI and help you achieve or maintain a healthy weight.  For females 50 years of age and older: ? A BMI below 18.5 is considered underweight. ? A BMI of 18.5 to 24.9 is normal. ? A BMI of 25 to 29.9 is considered overweight. ? A BMI of 30 and above is considered obese.  Watch levels of cholesterol and blood lipids  You should start having your blood tested for lipids and cholesterol at 42 years of age, then have this test every 5 years.  You may need to  have your cholesterol levels checked more often if: ? Your lipid or cholesterol levels are high. ? You are older than 42 years of age. ? You are at high risk for heart disease.  Cancer screening Lung Cancer  Lung cancer screening is recommended for adults 3-23 years old who are at high risk for lung cancer because of a history of smoking.  A yearly low-dose CT scan of the lungs is recommended for people who: ? Currently smoke. ? Have quit within the past 15 years. ? Have at least a 30-pack-year history of smoking. A pack year is smoking an average of one pack of cigarettes a day for 1 year.  Yearly screening should continue until it has been 15 years since you quit.  Yearly screening should stop if you develop a health problem that would prevent you from having lung cancer treatment.  Breast Cancer  Practice breast self-awareness. This means understanding how your breasts normally appear and feel.  It also means doing regular breast self-exams. Let your health care provider know about any changes, no matter how small.  If you are in your 20s or 30s, you should have a clinical breast exam (CBE) by a health care provider every 1-3 years as part of a regular health exam.  If you are 65 or older, have a CBE every year. Also consider having a breast X-ray (mammogram) every year.  If you have a family history of breast  cancer, talk to your health care provider about genetic screening.  If you are at high risk for breast cancer, talk to your health care provider about having an MRI and a mammogram every year.  Breast cancer gene (BRCA) assessment is recommended for women who have family members with BRCA-related cancers. BRCA-related cancers include: ? Breast. ? Ovarian. ? Tubal. ? Peritoneal cancers.  Results of the assessment will determine the need for genetic counseling and BRCA1 and BRCA2 testing.  Cervical Cancer Your health care provider may recommend that you be screened  regularly for cancer of the pelvic organs (ovaries, uterus, and vagina). This screening involves a pelvic examination, including checking for microscopic changes to the surface of your cervix (Pap test). You may be encouraged to have this screening done every 3 years, beginning at age 78.  For women ages 44-65, health care providers may recommend pelvic exams and Pap testing every 3 years, or they may recommend the Pap and pelvic exam, combined with testing for human papilloma virus (HPV), every 5 years. Some types of HPV increase your risk of cervical cancer. Testing for HPV may also be done on women of any age with unclear Pap test results.  Other health care providers may not recommend any screening for nonpregnant women who are considered low risk for pelvic cancer and who do not have symptoms. Ask your health care provider if a screening pelvic exam is right for you.  If you have had past treatment for cervical cancer or a condition that could lead to cancer, you need Pap tests and screening for cancer for at least 20 years after your treatment. If Pap tests have been discontinued, your risk factors (such as having a new sexual partner) need to be reassessed to determine if screening should resume. Some women have medical problems that increase the chance of getting cervical cancer. In these cases, your health care provider may recommend more frequent screening and Pap tests.  Colorectal Cancer  This type of cancer can be detected and often prevented.  Routine colorectal cancer screening usually begins at 42 years of age and continues through 42 years of age.  Your health care provider may recommend screening at an earlier age if you have risk factors for colon cancer.  Your health care provider may also recommend using home test kits to check for hidden blood in the stool.  A small camera at the end of a tube can be used to examine your colon directly (sigmoidoscopy or colonoscopy). This is  done to check for the earliest forms of colorectal cancer.  Routine screening usually begins at age 45.  Direct examination of the colon should be repeated every 5-10 years through 42 years of age. However, you may need to be screened more often if early forms of precancerous polyps or small growths are found.  Skin Cancer  Check your skin from head to toe regularly.  Tell your health care provider about any new moles or changes in moles, especially if there is a change in a mole's shape or color.  Also tell your health care provider if you have a mole that is larger than the size of a pencil eraser.  Always use sunscreen. Apply sunscreen liberally and repeatedly throughout the day.  Protect yourself by wearing long sleeves, pants, a wide-brimmed hat, and sunglasses whenever you are outside.  Heart disease, diabetes, and high blood pressure  High blood pressure causes heart disease and increases the risk of stroke. High blood pressure  is more likely to develop in: ? People who have blood pressure in the high end of the normal range (130-139/85-89 mm Hg). ? People who are overweight or obese. ? People who are African American.  If you are 109-58 years of age, have your blood pressure checked every 3-5 years. If you are 65 years of age or older, have your blood pressure checked every year. You should have your blood pressure measured twice-once when you are at a hospital or clinic, and once when you are not at a hospital or clinic. Record the average of the two measurements. To check your blood pressure when you are not at a hospital or clinic, you can use: ? An automated blood pressure machine at a pharmacy. ? A home blood pressure monitor.  If you are between 53 years and 53 years old, ask your health care provider if you should take aspirin to prevent strokes.  Have regular diabetes screenings. This involves taking a blood sample to check your fasting blood sugar level. ? If you are  at a normal weight and have a low risk for diabetes, have this test once every three years after 42 years of age. ? If you are overweight and have a high risk for diabetes, consider being tested at a younger age or more often. Preventing infection Hepatitis B  If you have a higher risk for hepatitis B, you should be screened for this virus. You are considered at high risk for hepatitis B if: ? You were born in a country where hepatitis B is common. Ask your health care provider which countries are considered high risk. ? Your parents were born in a high-risk country, and you have not been immunized against hepatitis B (hepatitis B vaccine). ? You have HIV or AIDS. ? You use needles to inject street drugs. ? You live with someone who has hepatitis B. ? You have had sex with someone who has hepatitis B. ? You get hemodialysis treatment. ? You take certain medicines for conditions, including cancer, organ transplantation, and autoimmune conditions.  Hepatitis C  Blood testing is recommended for: ? Everyone born from [birth year] through 1965. ? Anyone with known risk factors for hepatitis C.  Sexually transmitted infections (STIs)  You should be screened for sexually transmitted infections (STIs) including gonorrhea and chlamydia if: ? You are sexually active and are younger than 42 years of age. ? You are older than 42 years of age and your health care provider tells you that you are at risk for this type of infection. ? Your sexual activity has changed since you were last screened and you are at an increased risk for chlamydia or gonorrhea. Ask your health care provider if you are at risk.  If you do not have HIV, but are at risk, it may be recommended that you take a prescription medicine daily to prevent HIV infection. This is called pre-exposure prophylaxis (PrEP). You are considered at risk if: ? You are sexually active and do not regularly use condoms or know the HIV status of your  partner(s). ? You take drugs by injection. ? You are sexually active with a partner who has HIV.  Talk with your health care provider about whether you are at high risk of being infected with HIV. If you choose to begin PrEP, you should first be tested for HIV. You should then be tested every 3 months for as long as you are taking PrEP. Pregnancy  If you are premenopausal and  you may become pregnant, ask your health care provider about preconception counseling.  If you may become pregnant, take 400 to 800 micrograms (mcg) of folic acid every day.  If you want to prevent pregnancy, talk to your health care provider about birth control (contraception). Osteoporosis and menopause  Osteoporosis is a disease in which the bones lose minerals and strength with aging. This can result in serious bone fractures. Your risk for osteoporosis can be identified using a bone density scan.  If you are 59 years of age or older, or if you are at risk for osteoporosis and fractures, ask your health care provider if you should be screened.  Ask your health care provider whether you should take a calcium or vitamin D supplement to lower your risk for osteoporosis.  Menopause may have certain physical symptoms and risks.  Hormone replacement therapy may reduce some of these symptoms and risks. Talk to your health care provider about whether hormone replacement therapy is right for you. Follow these instructions at home:  Schedule regular health, dental, and eye exams.  Stay current with your immunizations.  Do not use any tobacco products including cigarettes, chewing tobacco, or electronic cigarettes.  If you are pregnant, do not drink alcohol.  If you are breastfeeding, limit how much and how often you drink alcohol.  Limit alcohol intake to no more than 1 drink per day for nonpregnant women. One drink equals 12 ounces of beer, 5 ounces of wine, or 1 ounces of hard liquor.  Do not use street  drugs.  Do not share needles.  Ask your health care provider for help if you need support or information about quitting drugs.  Tell your health care provider if you often feel depressed.  Tell your health care provider if you have ever been abused or do not feel safe at home. This information is not intended to replace advice given to you by your health care provider. Make sure you discuss any questions you have with your health care provider. Document Released: 02/08/2011 Document Revised: 01/01/2016 Document Reviewed: 04/29/2015 Elsevier Interactive Patient Education  [employer].

## 2017-11-04 ENCOUNTER — Encounter: Payer: Self-pay | Admitting: Internal Medicine

## 2017-11-04 NOTE — Telephone Encounter (Signed)
Please advise Dr Panosh, thanks.   

## 2017-11-08 ENCOUNTER — Ambulatory Visit
Admission: RE | Admit: 2017-11-08 | Discharge: 2017-11-08 | Disposition: A | Discharge status: Home / Self Care | Payer: BLUE CROSS/BLUE SHIELD | Source: Ambulatory Visit | Attending: Family Medicine | Admitting: Family Medicine

## 2017-11-08 DIAGNOSIS — M48061 Spinal stenosis, lumbar region without neurogenic claudication: Secondary | ICD-10-CM | POA: Diagnosis not present

## 2017-11-08 DIAGNOSIS — G8929 Other chronic pain: Secondary | ICD-10-CM

## 2017-11-08 DIAGNOSIS — M5441 Lumbago with sciatica, right side: Principal | ICD-10-CM

## 2017-11-08 NOTE — Telephone Encounter (Signed)
Usually not indicated without a symptom     But if considering taking cholesterol medication then  Sometimes we order a coronary artery calcium score   But   insurance doesn't usually pay for this.   Option also to  Refer  Since you have a  Heart disease hx in your family we could also do a referral to cardiology for  dv risk assessment   Let us know  What you think  And you want us to proceed with any of the above.

## 2017-12-27 DIAGNOSIS — Z01419 Encounter for gynecological examination (general) (routine) without abnormal findings: Secondary | ICD-10-CM | POA: Diagnosis not present

## 2017-12-27 DIAGNOSIS — Z1231 Encounter for screening mammogram for malignant neoplasm of breast: Secondary | ICD-10-CM | POA: Diagnosis not present

## 2017-12-27 DIAGNOSIS — Z6821 Body mass index (BMI) 21.0-21.9, adult: Secondary | ICD-10-CM | POA: Diagnosis not present

## 2017-12-27 DIAGNOSIS — Z124 Encounter for screening for malignant neoplasm of cervix: Secondary | ICD-10-CM | POA: Diagnosis not present

## 2018-03-09 ENCOUNTER — Other Ambulatory Visit: Payer: Self-pay | Admitting: Internal Medicine

## 2018-03-10 ENCOUNTER — Other Ambulatory Visit: Payer: Self-pay | Admitting: Internal Medicine

## 2018-03-10 ENCOUNTER — Ambulatory Visit: Payer: BLUE CROSS/BLUE SHIELD | Admitting: Internal Medicine

## 2018-03-10 ENCOUNTER — Encounter: Payer: Self-pay | Admitting: Internal Medicine

## 2018-03-10 VITALS — BP 110/78 | HR 81 | Temp 98.5°F | Wt 122.9 lb

## 2018-03-10 DIAGNOSIS — D72819 Decreased white blood cell count, unspecified: Secondary | ICD-10-CM

## 2018-03-10 DIAGNOSIS — R509 Fever, unspecified: Secondary | ICD-10-CM

## 2018-03-10 LAB — CBC WITH DIFFERENTIAL/PLATELET
BASOS ABS: 0 10*3/uL (ref 0.0–0.1)
Basophils Relative: 0.4 % (ref 0.0–3.0)
EOS PCT: 5.6 % — AB (ref 0.0–5.0)
Eosinophils Absolute: 0.1 10*3/uL (ref 0.0–0.7)
HEMATOCRIT: 41.2 % (ref 36.0–46.0)
Hemoglobin: 14.3 g/dL (ref 12.0–15.0)
LYMPHS PCT: 32.8 % (ref 12.0–46.0)
Lymphs Abs: 0.7 10*3/uL (ref 0.7–4.0)
MCHC: 34.7 g/dL (ref 30.0–36.0)
MCV: 91 fl (ref 78.0–100.0)
MONOS PCT: 18.4 % — AB (ref 3.0–12.0)
Monocytes Absolute: 0.4 10*3/uL (ref 0.1–1.0)
Neutro Abs: 0.9 10*3/uL — ABNORMAL LOW (ref 1.4–7.7)
Neutrophils Relative %: 42.8 % — ABNORMAL LOW (ref 43.0–77.0)
Platelets: 141 10*3/uL — ABNORMAL LOW (ref 150.0–400.0)
RBC: 4.53 Mil/uL (ref 3.87–5.11)
RDW: 12.4 % (ref 11.5–15.5)
WBC: 2.2 10*3/uL — ABNORMAL LOW (ref 4.0–10.5)

## 2018-03-10 LAB — HEPATIC FUNCTION PANEL
ALK PHOS: 59 U/L (ref 39–117)
ALT: 21 U/L (ref 0–35)
AST: 25 U/L (ref 0–37)
Albumin: 4.6 g/dL (ref 3.5–5.2)
Bilirubin, Direct: 0.1 mg/dL (ref 0.0–0.3)
TOTAL PROTEIN: 7.3 g/dL (ref 6.0–8.3)
Total Bilirubin: 0.6 mg/dL (ref 0.2–1.2)

## 2018-03-10 LAB — POC URINALSYSI DIPSTICK (AUTOMATED)
Bilirubin, UA: NEGATIVE
Blood, UA: NEGATIVE
GLUCOSE UA: NEGATIVE
Ketones, UA: NEGATIVE
Leukocytes, UA: NEGATIVE
Nitrite, UA: NEGATIVE
Protein, UA: POSITIVE — AB
SPEC GRAV UA: 1.025 (ref 1.010–1.025)
Urobilinogen, UA: 0.2 E.U./dL
pH, UA: 6 (ref 5.0–8.0)

## 2018-03-10 LAB — TSH: TSH: 1.34 u[IU]/mL (ref 0.35–4.50)

## 2018-03-10 LAB — BASIC METABOLIC PANEL
BUN: 22 mg/dL (ref 6–23)
CALCIUM: 9.7 mg/dL (ref 8.4–10.5)
CO2: 27 meq/L (ref 19–32)
Chloride: 104 mEq/L (ref 96–112)
Creatinine, Ser: 0.89 mg/dL (ref 0.40–1.20)
GFR: 73.79 mL/min (ref >60.00)
Glucose, Bld: 82 mg/dL (ref 70–99)
POTASSIUM: 4.3 meq/L (ref 3.5–5.1)
SODIUM: 140 meq/L (ref 135–145)

## 2018-03-10 LAB — SEDIMENTATION RATE: Sed Rate: 34 mm/hr — ABNORMAL HIGH (ref 0–20)

## 2018-03-10 LAB — C-REACTIVE PROTEIN: CRP: 0.3 mg/dL — AB (ref 0.5–20.0)

## 2018-03-10 NOTE — Progress Notes (Signed)
Chief Complaint  Patient presents with  . Fever    x 1 week. Pt states that her temp has been running 100-101. Took some Aleve and seemed to get some relief. Fever starting coming back at night time and she starting having to treat with Tylenol and Aleve. Pt states that she does not like taking medication so she tried to let her body rid itself of the fever. Some body aching. No fever today. Denies urinary issues, abd pain or low back pain.    HPI: 61[name] 42 y.o. come in for  sda    See above  No fever this am but   Fever yesterday  After gone for 3 days .   Onset Achy July 26th pm  And then had low grade fever. 99- 100 .  Aleve helped but felt weak again.   Mom had serious illness  So traveled   Onset  Temp  In evening   100 temp.  At hospital   Had some  migriane    July 29 - 31  And then felt feverish   Temp 101. Evening.  No meds today .  Feels better  ROS: See pertinent positives and negatives per HPI. No rash tick bites   uti gi resp sx .   Gets ha migraine triggered by fever but no  Other headaches   Past Medical History:  Diagnosis Date  . Borderline hypertension   . Endometriosis    surgery 2009  . Frequency of urination   . Hx of varicella   . Left ureteral stone   . Migraine   . PONV (postoperative nausea and vomiting)   . Urgency of urination     Family History  Problem Relation Age of Onset  . Hypertension Mother   . Breast cancer Mother        age 42   . Heart failure Mother        after breast cancer rx has icdalso   . Hypertension Father   . Nephrolithiasis Father     Social History   Socioeconomic History  . Marital status: Married    Spouse name: Not on file  . Number of children: Not on file  . Years of education: Not on file  . Highest education level: Not on file  Occupational History  . Not on file  Social Needs  . Financial resource strain: Not on file  . Food insecurity:    Worry: Not on file    Inability: Not on file  .  Transportation needs:    Medical: Not on file    Non-medical: Not on file  Tobacco Use  . Smoking status: Never Smoker  . Smokeless tobacco: Never Used  Substance and Sexual Activity  . Alcohol use: Yes    Alcohol/week: 0.0 oz    Comment: social  . Drug use: No  . Sexual activity: Yes    Partners: Male    Birth control/protection: IUD    Comment: Mirena IUD placed 2016 approx  Lifestyle  . Physical activity:    Days per week: Not on file    Minutes per session: Not on file  . Stress: Not on file  Relationships  . Social connections:    Talks on phone: Not on file    Gets together: Not on file    Attends religious service: Not on file    Active member of club or organization: Not on file    Attends meetings of  clubs or organizations: Not on file    Relationship status: Not on file  Other Topics Concern  . Not on file  Social History Narrative   6-8 hours of sleep per night   Does not work outside home   Married for 12 years with 2 children   Children are 79yrs and 5 yrs.   No pets   etoh social    Hot yoga  And walking    g2 p2   Born raised  [place]   College degree [employer] [place]    Outpatient Medications Prior to Visit  Medication Sig Dispense Refill  . Biotin 5000 MCG CAPS Take 1 capsule by mouth daily.     . Diclofenac Potassium (ZIPSOR) 25 MG CAPS Take 1 every 8 hours as needed for migraine headache. 30 capsule 2  . levonorgestrel (MIRENA) 20 MCG/24HR IUD by Intrauterine route.    . Multiple Vitamins-Minerals (HAIR VITAMINS) TABS Take 1 tablet by mouth daily.    . Naproxen Sodium (ALEVE) 220 MG CAPS Take by mouth as needed.    . Omega-3 Fatty Acids (FISH OIL) 1200 MG CAPS Take 1 capsule by mouth daily.     . Probiotic Product (PROBIOTIC DAILY) CAPS Take 1 capsule by mouth daily.    Marland Kitchen ZOLMitriptan (ZOMIG) 2.5 MG tablet TAKE 1 TABLET (2.5 MG TOTAL) BY MOUTH AS NEEDED FOR MIGRAINE. 10 tablet 2   No facility-administered medications  prior to visit.      EXAM:  BP 110/78 (BP Location: Right Arm, Patient Position: Sitting, Cuff Size: Normal)   Pulse 81   Temp 98.5 F (36.9 C) (Oral)   Wt 122 lb 14.4 oz (55.7 kg)   BMI 21.60 kg/m   Body mass index is 21.6 kg/m. Physical Exam: Vital signs reviewed ZOX:WRUE is a well-developed well-nourished alert cooperative  female who appears her stated age in no acute distress.  HEENT: normocephalic atraumatic , Eyes: PERRL EOM's full, conjunctiva clear, Nares: paten,t no deformity discharge or tenderness., Ears: no deformity EAC's clear TMs with normal landmarks. Mouth: clear OP, no lesions, edema.  Moist mucous membranes. Dentition in adequate repair. NECK: supple without masses, thyromegaly or bruits. CHEST/PULM:  Clear to auscultation and percussion breath sounds equal no wheeze , rales or rhonchi. . CV: PMI is nondisplaced, S1 S2 no gallops, murmurs, rubs. Peripheral pulses are full without delay.No JVD .  ABDOMEN: Bowel sounds normal nontender  No guard or rebound, no hepato splenomegal no CVA tenderness. . Extremtities:  No clubbing cyanosis or edema, no acute joint swelling or redness no focal atrophy NEURO:  Oriented x3, cranial nerves 3-12 appear to be intact, no obvious focal weakness,gait within normal limits  SKIN: No acute rashes normal turgor, color, no bruising or petechiae. PSYCH: Oriented, good eye contact, no obvious depression anxiety, cognition and judgment appear normal. LN: no cervical axillary inguinal adenopathy      Lab Results  Component Value Date   WBC 5.0 11/01/2017   HGB 13.5 11/01/2017   HCT 39.7 11/01/2017   PLT 175.0 11/01/2017   GLUCOSE 88 11/01/2017   CHOL 144 11/01/2017   TRIG 51.0 11/01/2017   HDL 52.80 11/01/2017   LDLCALC 81 11/01/2017   ALT 11 11/01/2017   AST 14 11/01/2017   NA 139 11/01/2017   K 3.8 11/01/2017   CL 107 11/01/2017   CREATININE 0.80 11/01/2017   BUN 17 11/01/2017   CO2 24 11/01/2017   TSH 1.53 11/01/2017    BP  Readings from Last 3 Encounters:  03/10/18 110/78  11/01/17 112/78  10/18/17 120/70    ASSESSMENT AND PLAN:  Discussed the following assessment and plan:  Fever, unspecified fever cause - Plan: Basic metabolic panel, CBC with Differential/Platelet, Hepatic function panel, TSH, POCT Urinalysis Dipstick (Automated), Sedimentation rate, C-reactive protein, HIV antibody Low  grade ? Fever  Uncertain cause    Poss viral non focal exam  Had last pm after  ? Gone for 3 days  No specific exposures    Will monitor  Baseline lab   Today and fu   If   persistent or progressive ( plans vacation [place] August 8th weekend) -Patient advised to return or notify health care team  if  new concerns arise.  Patient Instructions  Your exam is reassuring today .     Lab work and urine today  To check for signes  of infections.  Record her temps   Twice a day for the next week.   Fever 100.3 and above  Is considerate a level of fever that requires more evaluation if continuing 2 weeks and more .   If  Rash  Or new sx arise  Or not better next week  Let us know    Burna Mortimer K. Vara Mairena M.D.

## 2018-03-10 NOTE — Patient Instructions (Addendum)
Your exam is reassuring today .     Lab work and urine today  To check for signes  of infections.  Record her temps   Twice a day for the next week.   Fever 100.3 and above  Is considerate a level of fever that requires more evaluation if continuing 2 weeks and more .   If  Rash  Or new sx arise  Or not better next week  Let us know

## 2018-03-11 LAB — HIV ANTIBODY (ROUTINE TESTING W REFLEX): HIV: NONREACTIVE

## 2018-05-10 DIAGNOSIS — M25539 Pain in unspecified wrist: Secondary | ICD-10-CM | POA: Insufficient documentation

## 2018-05-10 DIAGNOSIS — M25531 Pain in right wrist: Secondary | ICD-10-CM | POA: Diagnosis not present

## 2018-06-09 ENCOUNTER — Ambulatory Visit: Payer: BLUE CROSS/BLUE SHIELD | Admitting: Internal Medicine

## 2018-06-09 ENCOUNTER — Ambulatory Visit (INDEPENDENT_AMBULATORY_CARE_PROVIDER_SITE_OTHER): Payer: BLUE CROSS/BLUE SHIELD

## 2018-06-09 ENCOUNTER — Encounter: Payer: Self-pay | Admitting: Internal Medicine

## 2018-06-09 VITALS — BP 112/80 | HR 82 | Temp 98.3°F | Wt 128.2 lb

## 2018-06-09 DIAGNOSIS — R509 Fever, unspecified: Secondary | ICD-10-CM | POA: Diagnosis not present

## 2018-06-09 DIAGNOSIS — R0789 Other chest pain: Secondary | ICD-10-CM | POA: Diagnosis not present

## 2018-06-09 DIAGNOSIS — D72819 Decreased white blood cell count, unspecified: Secondary | ICD-10-CM | POA: Diagnosis not present

## 2018-06-09 LAB — COMPREHENSIVE METABOLIC PANEL
ALK PHOS: 65 U/L (ref 39–117)
ALT: 12 U/L (ref 0–35)
AST: 16 U/L (ref 0–37)
Albumin: 4.8 g/dL (ref 3.5–5.2)
BILIRUBIN TOTAL: 0.6 mg/dL (ref 0.2–1.2)
BUN: 24 mg/dL — ABNORMAL HIGH (ref 6–23)
CO2: 26 mEq/L (ref 19–32)
Calcium: 10 mg/dL (ref 8.4–10.5)
Chloride: 105 mEq/L (ref 96–112)
Creatinine, Ser: 0.88 mg/dL (ref 0.40–1.20)
GFR: 74.66 mL/min (ref >60.00)
Glucose, Bld: 79 mg/dL (ref 70–99)
POTASSIUM: 4.6 meq/L (ref 3.5–5.1)
Sodium: 140 mEq/L (ref 135–145)
TOTAL PROTEIN: 7.4 g/dL (ref 6.0–8.3)

## 2018-06-09 LAB — CBC WITH DIFFERENTIAL/PLATELET
BASOS PCT: 0.2 % (ref 0.0–3.0)
Basophils Absolute: 0 10*3/uL (ref 0.0–0.1)
EOS ABS: 0.2 10*3/uL (ref 0.0–0.7)
Eosinophils Relative: 4 % (ref 0.0–5.0)
HCT: 41.3 % (ref 36.0–46.0)
HEMOGLOBIN: 14.2 g/dL (ref 12.0–15.0)
Lymphocytes Relative: 19.8 % (ref 12.0–46.0)
Lymphs Abs: 0.8 10*3/uL (ref 0.7–4.0)
MCHC: 34.5 g/dL (ref 30.0–36.0)
MCV: 91.3 fl (ref 78.0–100.0)
MONO ABS: 0.3 10*3/uL (ref 0.1–1.0)
Monocytes Relative: 7.8 % (ref 3.0–12.0)
NEUTROS ABS: 2.8 10*3/uL (ref 1.4–7.7)
Neutrophils Relative %: 68.2 % (ref 43.0–77.0)
PLATELETS: 196 10*3/uL (ref 150.0–400.0)
RBC: 4.52 Mil/uL (ref 3.87–5.11)
RDW: 12.8 % (ref 11.5–15.5)
WBC: 4.2 10*3/uL (ref 4.0–10.5)

## 2018-06-09 LAB — POC URINALSYSI DIPSTICK (AUTOMATED)
Bilirubin, UA: NEGATIVE
Glucose, UA: NEGATIVE
Ketones, UA: NEGATIVE
Leukocytes, UA: NEGATIVE
Nitrite, UA: NEGATIVE
PROTEIN UA: NEGATIVE
RBC UA: NEGATIVE
UROBILINOGEN UA: 0.2 U/dL
pH, UA: 6 (ref 5.0–8.0)

## 2018-06-09 LAB — SEDIMENTATION RATE: SED RATE: 12 mm/h (ref 0–20)

## 2018-06-09 NOTE — Patient Instructions (Addendum)
Will notify you  of labs when available.  X ray today  Exam is reassuring .  Monitor  Take you temp twice  A day for 2 weeks and then if get    new sx.  Let us know  And record  And plan follow up  We may get  Infectious disease or  Other  For  Consults  But  I dont see any issues with your exam today .

## 2018-06-09 NOTE — Progress Notes (Signed)
Chief Complaint  Patient presents with  . Fever    05/11/18 and 06/07/18 - pt states that it seems to coinside with when her normal menstral cycle would be active. Pt has IUD so she does not have a actual period. Pt states that her fevers will come on overnight and will turn into a migraine. Pt has to take migraine medication. Pt reports missing work when this happens. Pt reports with the most recent fever she did have a scratchy throat at the beginning. Son currently has possible strep throat.    HPI: [name] 42 y.o. come in for  Another 2 episodes of  A nocturnal fever and chills that triggered her migraine headache rspondeing to  medicaiton and then gone in between    Has a young child at home  No there exposures  .  8 /3  Episode an then oct 3rd    And then oct  30  At night chills and  Fever .  Poss 102   Range   No rx  Not the next day.  But triggres migraine and then takes migraine med and then helps  .  But dizzy . With either migarin or medication  ROS: See pertinent positives and negatives per HPI. No st  uti sx cough exercise intolerance medications new  Has iud so no  Periods but ? If  Related to when soul have period . ?   No rashes   joint pains except  The wrist cyst  . Totally well in  between by report .  No mouth ulcers  Hx sore throat   Breast cancer   And  Heart failure  In family no fevers autoimmune issues   Past Medical History:  Diagnosis Date  . Borderline hypertension   . Endometriosis    surgery 2009  . Frequency of urination   . Hx of varicella   . Left ureteral stone   . Migraine   . PONV (postoperative nausea and vomiting)   . Urgency of urination     Family History  Problem Relation Age of Onset  . Hypertension Mother   . Breast cancer Mother        age 45   . Heart failure Mother        after breast cancer rx has icdalso   . Hypertension Father   . Nephrolithiasis Father     Social History   Socioeconomic History  . Marital status:  Married    Spouse name: Not on file  . Number of children: Not on file  . Years of education: Not on file  . Highest education level: Not on file  Occupational History  . Not on file  Social Needs  . Financial resource strain: Not on file  . Food insecurity:    Worry: Not on file    Inability: Not on file  . Transportation needs:    Medical: Not on file    Non-medical: Not on file  Tobacco Use  . Smoking status: Never Smoker  . Smokeless tobacco: Never Used  Substance and Sexual Activity  . Alcohol use: Yes    Alcohol/week: 0.0 standard drinks    Comment: social  . Drug use: No  . Sexual activity: Yes    Partners: Male    Birth control/protection: IUD    Comment: Mirena IUD placed 2016 approx  Lifestyle  . Physical activity:    Days per week: Not on file    Minutes  per session: Not on file  . Stress: Not on file  Relationships  . Social connections:    Talks on phone: Not on file    Gets together: Not on file    Attends religious service: Not on file    Active member of club or organization: Not on file    Attends meetings of clubs or organizations: Not on file    Relationship status: Not on file  Other Topics Concern  . Not on file  Social History Narrative   6-8 hours of sleep per night   Does not work outside home   Married for 12 years with 2 children   Children are 32yrs and 5 yrs.   No pets   etoh social    Hot yoga  And walking    g2 p2   Born raised  [place]   College degree un[employer] [place]    Outpatient Medications Prior to Visit  Medication Sig Dispense Refill  . Biotin 5000 MCG CAPS Take 1 capsule by mouth daily.     . Diclofenac Potassium (ZIPSOR) 25 MG CAPS Take 1 every 8 hours as needed for migraine headache. 30 capsule 2  . levonorgestrel (MIRENA) 20 MCG/24HR IUD by Intrauterine route.    . Magnesium 400 MG CAPS Take 1 tablet by mouth daily.    . Multiple Vitamins-Minerals (HAIR VITAMINS) TABS Take 1 tablet by  mouth daily.    . Naproxen Sodium (ALEVE) 220 MG CAPS Take by mouth as needed.    . Omega-3 Fatty Acids (FISH OIL) 1200 MG CAPS Take 1 capsule by mouth daily.     . Probiotic Product (PROBIOTIC DAILY) CAPS Take 1 capsule by mouth daily.    Marland Kitchen ZOLMitriptan (ZOMIG) 2.5 MG tablet TAKE 1 TABLET (2.5 MG TOTAL) BY MOUTH AS NEEDED FOR MIGRAINE. 10 tablet 2   No facility-administered medications prior to visit.      EXAM:  BP 112/80 (BP Location: Right Arm, Patient Position: Sitting, Cuff Size: Normal)   Pulse 82   Temp 98.3 F (36.8 C) (Oral)   Wt 128 lb 3.2 oz (58.2 kg)   BMI 22.53 kg/m   Body mass index is 22.53 kg/m. Physical Exam: Vital signs reviewed ZOX:WRUE is a well-developed well-nourished alert cooperative  female who appears her stated age in no acute distress.  HEENT: normocephalic atraumatic , Eyes: PERRL EOM's full, conjunctiva clear, Nares: paten,t no deformity discharge or tenderness., Ears: no deformity EAC's clear TMs with normal landmarks. Mouth: clear OP, no lesions, edema.  Moist mucous membranes. Dentition in adequate repair. NECK: supple without masses, thyromegaly or bruits. CHEST/PULM:  Clear to auscultation and percussion breath sounds equal no wheeze , rales or rhonchi. CV: PMI is nondisplaced, S1 S2 no gallops, murmurs, rubs. Peripheral pulses are full without delay.No JVD .  ABDOMEN: Bowel sounds normal nontender  No guard or rebound, no hepato splenomegal no CVA tenderness.   Extremtities:  No clubbing cyanosis or edema, no acute joint swelling or redness no focal atrophy  Ganglion cyst  Wrist  NEURO:  Oriented x3, cranial nerves 3-12 appear to be intact, no obvious focal weakness,gait within normal limits no abnormal reflexes or asymmetrical SKIN: No acute rashes normal turgor, color, no bruising or petechiae. PSYCH: Oriented, good eye contact, no obvious depression anxiety, cognition and judgment appear normal. LN: no cervical axillary inguinal  adenopathy   Lab Results  Component Value Date   WBC 2.2 Repeated and verified X2. (L) 03/10/2018  HGB 14.3 03/10/2018   HCT 41.2 03/10/2018   PLT 141.0 (L) 03/10/2018   GLUCOSE 82 03/10/2018   CHOL 144 11/01/2017   TRIG 51.0 11/01/2017   HDL 52.80 11/01/2017   LDLCALC 81 11/01/2017   ALT 21 03/10/2018   AST 25 03/10/2018   NA 140 03/10/2018   K 4.3 03/10/2018   CL 104 03/10/2018   CREATININE 0.89 03/10/2018   BUN 22 03/10/2018   CO2 27 03/10/2018   TSH 1.34 03/10/2018   BP Readings from Last 3 Encounters:  06/09/18 112/80  03/10/18 110/78  11/01/17 112/78    ASSESSMENT AND PLAN:  Discussed the following assessment and plan:  Fever, unspecified fever cause - Plan: CBC with Differential/Platelet, POCT Urinalysis Dipstick (Automated), ANA, CMP, Lactate Dehydrogenase, Epstein-Barr virus VCA antibody panel, Rheumatoid Factor, Sedimentation rate, DG Chest 2 View  Leukopenia, unspecified type - x 1 in august 2019  repeating today  Exam is normal    Cbc never go repeated but will do today    3 episode of short lived fever in 102 range  No clues  as to cause of the fever.  And patient says  Lasts less than 24 hours   Will  Have her track  Temp bid for 2 weeks and then plan fu mah get ID ot see her assuming  No other cause obvious.  ? Cyclic neurtopenia?  Other   Over due for repeat   Cbc  No adenopathy or  HS megaly oin exam .  -Patient advised to return or notify health care team  if  new concerns arise.  Patient Instructions  Will notify you  of labs when available.  X ray today  Exam is reassuring .  Monitor  Take you temp twice  A day for 2 weeks and then if get    new sx.  Let us know  And record  And plan follow up  We may get  Infectious disease or  Other  For  Consults  But  I dont see any issues with your exam today .    Neta Mends. Panosh M.D.

## 2018-06-13 LAB — EPSTEIN-BARR VIRUS VCA ANTIBODY PANEL
EBV VCA IgG: 387 U/mL — ABNORMAL HIGH
EBV VCA IgM: <36 U/mL

## 2018-06-13 LAB — RHEUMATOID FACTOR

## 2018-06-13 LAB — ANTI-NUCLEAR AB-TITER (ANA TITER): ANA Titer 1: 1:640 {titer} — ABNORMAL HIGH

## 2018-06-13 LAB — ANA: Anti Nuclear Antibody(ANA): POSITIVE — AB

## 2018-06-13 LAB — LACTATE DEHYDROGENASE: LDH: 153 U/L (ref 100–200)

## 2018-06-14 ENCOUNTER — Other Ambulatory Visit: Payer: Self-pay | Admitting: Internal Medicine

## 2018-06-14 DIAGNOSIS — R768 Other specified abnormal immunological findings in serum: Secondary | ICD-10-CM

## 2018-06-14 DIAGNOSIS — R509 Fever, unspecified: Secondary | ICD-10-CM

## 2018-06-14 NOTE — Telephone Encounter (Signed)
Please advise Dr Panosh, thanks.   

## 2018-06-14 NOTE — Telephone Encounter (Signed)
Not a clinically significant finding    You probably had mono when your were younger and  Didn't know it  ( Fever sore throat for a  while) Sometimes if infected   As a young child    Not  Identified. As such  Just acts like any other virus and get better on its own.

## 2018-06-19 ENCOUNTER — Telehealth: Payer: Self-pay

## 2018-06-19 DIAGNOSIS — Z8249 Family history of ischemic heart disease and other diseases of the circulatory system: Secondary | ICD-10-CM

## 2018-06-19 NOTE — Telephone Encounter (Signed)
Left message on machine for patient to return our call. The scan will cost $150 self pay- insurance will not cover. [id number] Would patient like to have the scan?  CRM

## 2018-06-19 NOTE — Telephone Encounter (Signed)
There are many types of heart scans.  I would order a coronary artery calcium scan  To assess plaque build up  To decide  I intervention advised  Dx  Family hx of heart disease    Her  Lipid panel results are  not high risk   The 10-year ASCVD risk score [name]., et al., 2013) is: 0.3%   Values used to calculate the score:     Age: 42 years     Sex: Female     Is Non-Hispanic African American: No     Diabetic: No     Tobacco smoker: No     Systolic Blood Pressure: 112 mmHg     Is BP treated: No     HDL Cholesterol: 52.8 mg/dL     Total Cholesterol: 144 mg/dL

## 2018-06-19 NOTE — Telephone Encounter (Signed)
Copied from CRM [id number]. Topic: General - Other >> Jun 19, 2018 11:08 AM Elliot Gault wrote: Relation to pt: self  Call back number: [phone]    Reason for call:  Patient was last  seen 06/09/18 by Dr. Fabian Sharp, patient requesting "heart scan" stating mother has heart disease. Patient currently not experiencing any symptoms stating she would like heart scan for a peace of mind, requesting orders, please advise.

## 2018-06-19 NOTE — Telephone Encounter (Signed)
Pt returned call.  Copied from CRM [id number]. Topic: Quick Communication - See Telephone Encounter >> Jun 19, 2018  2:22 PM Trenton Gammon, CMA wrote: CRM for notification. See Telephone encounter for: 06/19/18.  Okay for triage nurse to speak with patient

## 2018-06-19 NOTE — Telephone Encounter (Signed)
Order placed

## 2018-06-19 NOTE — Telephone Encounter (Signed)
Returned call to pt who states she would still like to have the scan done with the self pay being $150 and would like to know where the scan would be performed.

## 2018-06-26 ENCOUNTER — Encounter: Payer: Self-pay | Admitting: Internal Medicine

## 2018-06-26 ENCOUNTER — Ambulatory Visit: Payer: BLUE CROSS/BLUE SHIELD | Admitting: Internal Medicine

## 2018-06-26 DIAGNOSIS — R509 Fever, unspecified: Secondary | ICD-10-CM | POA: Diagnosis not present

## 2018-06-26 NOTE — Progress Notes (Signed)
[facility]      Reason for Consult: fever    Referring Physician: Dr. Fabian SharpPanosh    Patient ID: Nicole AlexandriaPurvi T Bradshaw, female    DOB: [birth year], 42 y.o.   MRN: [id number]  HPI:   She comes in for evaluation of above.  She has noted since about August of this year of a cyclical fever that occurs within 2 to 3 days of her menstrual period.  This has happened about 3 times and includes a fever up to 102 followed by moderate migraine headache.  She does have a long history of migraine headaches and has never had an associated fever with it.  She reports no other particular symptoms including no weight loss, no lymphadenopathy, no rashes or hives, no joint complaints other than her bilateral wrists which have been hurting prior to this.  She has had no vision changes, cough or other associated symptoms.  No autoimmune disease in her family.  She did have one CBC done in about August with some leukopenia and thrombocytopenia but on repeat earlier this month, it is completely resolved.  She additionally has had some increased eosinophils though again most recently, this is within normal limits.  She had a chest x-ray with some mild predominance but no obvious opacity, or nodules.  Recent labs done by Dr. Fabian SharpPanosh reveal a positive ANA of 1-640 but otherwise normal sed rate, negative rheumatoid factor, Epstein-Barr with old disease, normal LDH and a normal urinalysis.  Previous record reviewed in Epic as above.    Past Medical History:  Diagnosis Date  . Borderline hypertension   . Endometriosis    surgery 2009  . Frequency of urination   . Hx of varicella   . Left ureteral stone   . Migraine   . PONV (postoperative nausea and vomiting)   . Urgency of urination     Prior to Admission medications   Medication Sig Start Date End Date Taking? Authorizing Provider  Biotin 5000 MCG CAPS Take 1 capsule by mouth daily.     [provider]  Diclofenac Potassium (ZIPSOR) 25  MG CAPS Take 1 every 8 hours as needed for migraine headache. 10/17/15   Panosh, Neta MendsWanda K, MD  levonorgestrel (MIRENA) 20 MCG/24HR IUD by Intrauterine route.    [provider]  Magnesium 400 MG CAPS Take 1 tablet by mouth daily.    [provider]  Multiple Vitamins-Minerals (HAIR VITAMINS) TABS Take 1 tablet by mouth daily.    [provider]  Naproxen Sodium (ALEVE) 220 MG CAPS Take by mouth as needed.    [provider]  Omega-3 Fatty Acids (FISH OIL) 1200 MG CAPS Take 1 capsule by mouth daily.     [provider]  Probiotic Product (PROBIOTIC DAILY) CAPS Take 1 capsule by mouth daily.    [provider]  ZOLMitriptan (ZOMIG) 2.5 MG tablet TAKE 1 TABLET (2.5 MG TOTAL) BY MOUTH AS NEEDED FOR MIGRAINE. 03/09/18   Panosh, Neta MendsWanda K, MD    No Known Allergies  Social History   Tobacco Use  . Smoking status: Never Smoker  . Smokeless tobacco: Never Used  Substance Use Topics  . Alcohol use: Yes    Alcohol/week: 0.0 standard drinks    Comment: social  . Drug use: No    Family History  Problem Relation Age of Onset  . Hypertension Mother   . Breast cancer Mother        age 665   .  Heart failure Mother        after breast cancer rx has icdalso   . Hypertension Father   . Nephrolithiasis Father     Review of Systems  Constitutional: negative for chills, malaise, anorexia and weight loss Ears, nose, mouth, throat, and face: negative for hearing loss, ear drainage and nasal congestion Respiratory: negative for cough or sputum Gastrointestinal: negative for nausea and diarrhea Genitourinary: negative for frequency and dysuria Musculoskeletal: negative for myalgias, arthralgias; has chronic neck and back pain/sciatica.  Her wrists have been painful but no swelling, warmth Allergic/Immunologic: negative for urticaria All other systems reviewed and are negative    Constitutional: in no apparent distress  EYES: anicteric ENMT: no  thrush Cardiovascular: Cor RRR Respiratory: CTA B; normal respiratory effort GI: soft, nt Musculoskeletal: no pedal edema noted Skin: no rashes Hematologic: no cervical, no supraclavicular, no axillary LAD  Labs: Lab Results  Component Value Date   WBC 4.2 06/09/2018   HGB 14.2 06/09/2018   HCT 41.3 06/09/2018   MCV 91.3 06/09/2018   PLT 196.0 06/09/2018    Lab Results  Component Value Date   CREATININE 0.88 06/09/2018   BUN 24 (H) 06/09/2018   NA 140 06/09/2018   K 4.6 06/09/2018   CL 105 06/09/2018   CO2 26 06/09/2018    Lab Results  Component Value Date   ALT 12 06/09/2018   AST 16 06/09/2018   ALKPHOS 65 06/09/2018   BILITOT 0.6 06/09/2018     Assessment: she has isolated fevers up to 102 associated with her menstrual period.  No other concerning symptoms to suggest infectious, rheumatologic or cancerous etiology.  Work up by Dr. Fabian Sharp noted and with no associated symptoms I do not feel further work up indicated.  I discussed with her that the fever either will diminsh over time (can be months) or new symptoms will develop and a targeted work up should be pursued.   I do wonder about the IUD, though no reported association with fever in a brief literature review, could be hormone-associated I would consider getting her IUD out if the fever persists after her rheumatology appt.    Plan: 1) observation of fever and consider IUD removal if fever persists 2) return here if new symptoms develop. Thanks for the consultation.

## 2018-07-04 ENCOUNTER — Other Ambulatory Visit: Payer: BLUE CROSS/BLUE SHIELD

## 2018-07-05 NOTE — Progress Notes (Deleted)
Office Visit Note  Patient: [name]             Date of Birth: 1976/06/04           MRN: [id number]             PCP: Madelin Headings, MD Referring: Madelin Headings, MD Visit Date: 07/18/2018 Occupation: @GUAROCC @  Subjective:  No chief complaint on file.   History of Present Illness: [name] is a 42 y.o. female ***   Activities of Daily Living:  Patient reports morning stiffness for *** {minute/hour:19697}.   Patient {ACTIONS;DENIES/REPORTS:21021675::"Denies"} nocturnal pain.  Difficulty dressing/grooming: {ACTIONS;DENIES/REPORTS:21021675::"Denies"} Difficulty climbing stairs: {ACTIONS;DENIES/REPORTS:21021675::"Denies"} Difficulty getting out of chair: {ACTIONS;DENIES/REPORTS:21021675::"Denies"} Difficulty using hands for taps, buttons, cutlery, and/or writing: {ACTIONS;DENIES/REPORTS:21021675::"Denies"}  No Rheumatology ROS completed.   PMFS History:  Patient Active Problem List   Diagnosis Date Noted  . Intermittent FUO 06/26/2018  . Wrist joint pain 05/10/2018  . Recurrent headache 11/24/2014  . Elevated BP 11/24/2014  . Nasal obstruction 06/28/2014    Past Medical History:  Diagnosis Date  . Borderline hypertension   . Endometriosis    surgery 2009  . Frequency of urination   . Hx of varicella   . Left ureteral stone   . Migraine   . PONV (postoperative nausea and vomiting)   . Urgency of urination     Family History  Problem Relation Age of Onset  . Hypertension Mother   . Breast cancer Mother        age 7   . Heart failure Mother        after breast cancer rx has icdalso   . Hypertension Father   . Nephrolithiasis Father    Past Surgical History:  Procedure Laterality Date  . CYSTOSCOPY/RETROGRADE/URETEROSCOPY/STONE EXTRACTION WITH BASKET Left 02/13/2016   Procedure: CYSTOSCOPY/RETROGRADE/URETEROSCOPY/STONE EXTRACTION WITH BASKET;  Surgeon: Marcine Matar, MD;  Location: [facility];  Service: Urology;  Laterality: Left;   . LAPAROSCOPY LYSIS ADHESIONS/  ABLATION AND BX'S OF ENDOMETRIAL LESIONS/  RIGHT FIMBROPLASTY  10-02-2007  . NASAL SEPTUM SURGERY  02/ 2016  . TONSILLECTOMY  1985   Social History   Social History Narrative   6-8 hours of sleep per night   Does not work outside home   Married for 12 years with 2 children   Children are 90yrs and 5 yrs.   No pets   etoh social    Hot yoga  And walking    g2 p2   Born raised  [place] [address] degree [employer] [place]    Objective: Vital Signs: There were no vitals taken for this visit.   Physical Exam   Musculoskeletal Exam: ***  CDAI Exam: CDAI Score: Not documented Patient Global Assessment: Not documented; Provider Global Assessment: Not documented Swollen: Not documented; Tender: Not documented Joint Exam   Not documented   There is currently no information documented on the homunculus. Go to the Rheumatology activity and complete the homunculus joint exam.  Investigation: Findings:  06/09/18: ANA 1:640 NS, sed rate 2, RF<14, LDH 153, EBV VCA IgG + 387, IgM negative  Component     Latest Ref Rng & Units 06/09/2018  EBV VCA IgM     U/mL <36.00  EBV VCA IgG     U/mL 387.00 (H)  EBV NA IgG     U/mL >600.00 (H)  Interpretation        ANA Titer 1     titer  1:640 (H)  ANA Pattern 1      Nuclear, Speckled (A)  LDH     100 - 200 U/L 153  RA Latex Turbid.     <14 IU/mL <14  Sed Rate     0 - 20 mm/hr 12   Imaging: Dg Chest 2 View  Result Date: 06/09/2018 CLINICAL DATA:  Mid chest discomfort.  Fever. EXAM: CHEST - 2 VIEW COMPARISON:  CT 12/01/2015. FINDINGS: Mediastinum hilar structures are normal. Mild bilateral pulmonary interstitial prominence suggesting pneumonitis. No focal alveolar infiltrate. No pleural effusion or pneumothorax. IMPRESSION: Mild bilateral interstitial prominence noted suggesting mild pneumonitis. No acute alveolar infiltrate. Electronically Signed   By: Maisie Fushomas  Register   On:  06/09/2018 12:44    Recent Labs: Lab Results  Component Value Date   WBC 4.2 06/09/2018   HGB 14.2 06/09/2018   PLT 196.0 06/09/2018   NA 140 06/09/2018   K 4.6 06/09/2018   CL 105 06/09/2018   CO2 26 06/09/2018   GLUCOSE 79 06/09/2018   BUN 24 (H) 06/09/2018   CREATININE 0.88 06/09/2018   BILITOT 0.6 06/09/2018   ALKPHOS 65 06/09/2018   AST 16 06/09/2018   ALT 12 06/09/2018   PROT 7.4 06/09/2018   ALBUMIN 4.8 06/09/2018   CALCIUM 10.0 06/09/2018   GFRAA >60 02/11/2015    Speciality Comments: No specialty comments available.  Procedures:  No procedures performed Allergies: Patient has no known allergies.   Assessment / Plan:     Visit Diagnoses: Positive ANA (antinuclear antibody)  Hx of migraines  History of endometriosis  Left ureteral stone  Hx of varicella   Orders: No orders of the defined types were placed in this encounter.  No orders of the defined types were placed in this encounter.   Face-to-face time spent with patient was *** minutes. Greater than 50% of time was spent in counseling and coordination of care.  Follow-Up Instructions: No follow-ups on file.   Gearldine Bienenstockaylor M Dale, PA-C  Note - This record has been created using Dragon software.  Chart creation errors have been sought, but may not always  have been located. Such creation errors do not reflect on  the standard of medical care.

## 2018-07-12 ENCOUNTER — Ambulatory Visit (INDEPENDENT_AMBULATORY_CARE_PROVIDER_SITE_OTHER)
Admission: RE | Admit: 2018-07-12 | Discharge: 2018-07-12 | Disposition: A | Discharge status: Home / Self Care | Payer: Self-pay | Source: Ambulatory Visit | Attending: Internal Medicine | Admitting: Internal Medicine

## 2018-07-12 DIAGNOSIS — Z8249 Family history of ischemic heart disease and other diseases of the circulatory system: Secondary | ICD-10-CM

## 2018-07-17 ENCOUNTER — Ambulatory Visit: Payer: BLUE CROSS/BLUE SHIELD | Admitting: Internal Medicine

## 2018-07-17 ENCOUNTER — Encounter: Payer: Self-pay | Admitting: Internal Medicine

## 2018-07-17 VITALS — BP 118/78 | HR 71 | Temp 98.5°F | Wt 130.7 lb

## 2018-07-17 DIAGNOSIS — R768 Other specified abnormal immunological findings in serum: Secondary | ICD-10-CM | POA: Diagnosis not present

## 2018-07-17 DIAGNOSIS — Z7189 Other specified counseling: Secondary | ICD-10-CM | POA: Diagnosis not present

## 2018-07-17 DIAGNOSIS — Z8249 Family history of ischemic heart disease and other diseases of the circulatory system: Secondary | ICD-10-CM | POA: Diagnosis not present

## 2018-07-17 NOTE — Progress Notes (Signed)
Chief Complaint  Patient presents with  . Follow-up    Cardiac test results review    HPI: 35[name] 42 y.o. come in for fu  cac score  For risk assessment  Requested by  Patient  As has family hx of  HD . She has no cv sx   Saw ID  And  At that time fever subsided  And felt to be viral or    No other work up needed?  Has pos ana but am back pain ans stiffness  is now better   Had to delay the rheum appt  Feels much better   ROS: See pertinent positives and negatives per HPI.  Past Medical History:  Diagnosis Date  . Borderline hypertension   . Endometriosis    surgery 2009  . Frequency of urination   . Hx of varicella   . Left ureteral stone   . Migraine   . PONV (postoperative nausea and vomiting)   . Urgency of urination     Family History  Problem Relation Age of Onset  . Hypertension Mother   . Breast cancer Mother        age 42   . Heart failure Mother        after breast cancer rx has icdalso   . Hypertension Father   . Nephrolithiasis Father     Social History   Socioeconomic History  . Marital status: Married    Spouse name: Not on file  . Number of children: Not on file  . Years of education: Not on file  . Highest education level: Not on file  Occupational History  . Not on file  Social Needs  . Financial resource strain: Not on file  . Food insecurity:    Worry: Not on file    Inability: Not on file  . Transportation needs:    Medical: Not on file    Non-medical: Not on file  Tobacco Use  . Smoking status: Never Smoker  . Smokeless tobacco: Never Used  Substance and Sexual Activity  . Alcohol use: Yes    Alcohol/week: 0.0 standard drinks    Comment: social  . Drug use: No  . Sexual activity: Yes    Partners: Male    Birth control/protection: IUD    Comment: Mirena IUD placed 2016 approx  Lifestyle  . Physical activity:    Days per week: Not on file    Minutes per session: Not on file  . Stress: Not on file  Relationships    . Social connections:    Talks on phone: Not on file    Gets together: Not on file    Attends religious service: Not on file    Active member of club or organization: Not on file    Attends meetings of clubs or organizations: Not on file    Relationship status: Not on file  Other Topics Concern  . Not on file  Social History Narrative   6-8 hours of sleep per night   Does not work outside home   Married for 12 years with 2 children   Children are 6549yrs and 5 yrs.   No pets   etoh social    Hot yoga  And walking    g2 p2   Born raised  [place] heritage [place]   College degree [employer]    Outpatient Medications Prior to Visit  Medication Sig Dispense Refill  . Biotin 5000 MCG CAPS  Take 1 capsule by mouth daily.     . Collagen Hydrolysate POWD 1 Scoop daily    . Diclofenac Potassium (ZIPSOR) 25 MG CAPS Take 1 every 8 hours as needed for migraine headache. 30 capsule 2  . levonorgestrel (MIRENA) 20 MCG/24HR IUD by Intrauterine route.    . Magnesium 400 MG CAPS Take 1 tablet by mouth daily.    . Multiple Vitamins-Minerals (HAIR VITAMINS) TABS Take 1 tablet by mouth daily.    . Naproxen Sodium (ALEVE) 220 MG CAPS Take by mouth as needed.    . Omega-3 Fatty Acids (FISH OIL) 1200 MG CAPS Take 1 capsule by mouth daily.     . Probiotic Product (PROBIOTIC DAILY) CAPS Take 1 capsule by mouth daily.    Marland Kitchen ZOLMitriptan (ZOMIG) 2.5 MG tablet TAKE 1 TABLET (2.5 MG TOTAL) BY MOUTH AS NEEDED FOR MIGRAINE. 10 tablet 2   No facility-administered medications prior to visit.      EXAM:  BP 118/78 (BP Location: Right Arm, Patient Position: Sitting, Cuff Size: Normal)   Pulse 71   Temp 98.5 F (36.9 C) (Oral)   Wt 130 lb 11.2 oz (59.3 kg)   LMP 06/28/2018   BMI 22.97 kg/m   Body mass index is 22.97 kg/m.  GENERAL: vitals reviewed and listed above, alert, oriented, appears well hydrated and in no acute distress HEENT: atraumatic, conjunctiva  clear, no obvious  abnormalities on inspection of external nose and ears PSYCH: pleasant and cooperative, no obvious depression or anxiety Lab Results  Component Value Date   WBC 4.2 06/09/2018   HGB 14.2 06/09/2018   HCT 41.3 06/09/2018   PLT 196.0 06/09/2018   GLUCOSE 79 06/09/2018   CHOL 144 11/01/2017   TRIG 51.0 11/01/2017   HDL 52.80 11/01/2017   LDLCALC 81 11/01/2017   ALT 12 06/09/2018   AST 16 06/09/2018   NA 140 06/09/2018   K 4.6 06/09/2018   CL 105 06/09/2018   CREATININE 0.88 06/09/2018   BUN 24 (H) 06/09/2018   CO2 26 06/09/2018   TSH 1.34 03/10/2018   BP Readings from Last 3 Encounters:  07/17/18 118/78  06/09/18 112/80  03/10/18 110/78    ASSESSMENT AND PLAN:  Discussed the following assessment and plan:  Cardiac risk counseling - cacscore is 0   Family history of heart disease  Positive ANA (antinuclear antibody) - fever resolved  cacscore  of zero  Low risk scan   Has no sx and not a functional test  Patient is aware -Patient advised to return or notify health care team  if  new concerns arise.  Patient Instructions  Glad the CAC  Is zero  Since you are feeling better then  Can wait on the rheum appt but  Still.   Would advise follow up.  Could be a false positive . But  If having sx should evaluate for risk of autoimmune   Condition.   Continue lifestyle intervention healthy eating and exercise . As you are doing      Neta Mends. Panosh M.D.

## 2018-07-17 NOTE — Patient Instructions (Addendum)
Glad the CAC  Is zero  Since you are feeling better then  Can wait on the rheum appt but  Still.   Would advise follow up.  Could be a false positive . But  If having sx should evaluate for risk of autoimmune   Condition.   Continue lifestyle intervention healthy eating and exercise . As you are doing

## 2018-07-18 ENCOUNTER — Ambulatory Visit: Payer: Self-pay | Admitting: Rheumatology

## 2018-08-10 NOTE — Progress Notes (Deleted)
Office Visit Note  Patient: [name]             Date of Birth: 02-23-1976           MRN: [id number]             PCP: Madelin Headings, MD Referring: Madelin Headings, MD Visit Date: 08/17/2018 Occupation: @GUAROCC @  Subjective:  No chief complaint on file.   History of Present Illness: [name] is a 43 y.o. female ***   Activities of Daily Living:  Patient reports morning stiffness for *** {minute/hour:19697}.   Patient {ACTIONS;DENIES/REPORTS:21021675::"Denies"} nocturnal pain.  Difficulty dressing/grooming: {ACTIONS;DENIES/REPORTS:21021675::"Denies"} Difficulty climbing stairs: {ACTIONS;DENIES/REPORTS:21021675::"Denies"} Difficulty getting out of chair: {ACTIONS;DENIES/REPORTS:21021675::"Denies"} Difficulty using hands for taps, buttons, cutlery, and/or writing: {ACTIONS;DENIES/REPORTS:21021675::"Denies"}  No Rheumatology ROS completed.   PMFS History:  Patient Active Problem List   Diagnosis Date Noted  . Family history of heart disease 07/17/2018  . Intermittent FUO 06/26/2018  . Wrist joint pain 05/10/2018  . Recurrent headache 11/24/2014  . Elevated BP 11/24/2014  . Nasal obstruction 06/28/2014    Past Medical History:  Diagnosis Date  . Borderline hypertension   . Endometriosis    surgery 2009  . Frequency of urination   . Hx of varicella   . Left ureteral stone   . Migraine   . PONV (postoperative nausea and vomiting)   . Urgency of urination     Family History  Problem Relation Age of Onset  . Hypertension Mother   . Breast cancer Mother        age 43   . Heart failure Mother        after breast cancer rx has icdalso   . Hypertension Father   . Nephrolithiasis Father    Past Surgical History:  Procedure Laterality Date  . CYSTOSCOPY/RETROGRADE/URETEROSCOPY/STONE EXTRACTION WITH BASKET Left 02/13/2016   Procedure: CYSTOSCOPY/RETROGRADE/URETEROSCOPY/STONE EXTRACTION WITH BASKET;  Surgeon: Marcine Matar, MD;  Location: [facility];  Service: Urology;  Laterality: Left;  . LAPAROSCOPY LYSIS ADHESIONS/  ABLATION AND BX'S OF ENDOMETRIAL LESIONS/  RIGHT FIMBROPLASTY  10-02-2007  . NASAL SEPTUM SURGERY  02/ 2016  . TONSILLECTOMY  1985   Social History   Social History Narrative   6-8 hours of sleep per night   Does not work outside home   Married for 12 years with 2 children   Children are 86yrs and 5 yrs.   No pets   etoh social    Hot yoga  And walking    g2 p2   Born raised  [place] heritage [address] degree [employer]    Objective: Vital Signs: There were no vitals taken for this visit.   Physical Exam   Musculoskeletal Exam: ***  CDAI Exam: CDAI Score: Not documented Patient Global Assessment: Not documented; Provider Global Assessment: Not documented Swollen: Not documented; Tender: Not documented Joint Exam   Not documented   There is currently no information documented on the homunculus. Go to the Rheumatology activity and complete the homunculus joint exam.  Investigation: Findings:  03/10/18: Hepatic function panel WNL, HIV negative, TSH 1.34, Sed rate 34, CRP 0.3 06/09/18: ANA 1:640 NS, LDH 153, RF <14, Sed rate 12, EBV IgG+, IgM-  Component     Latest Ref Rng & Units 06/09/2018  EBV VCA IgM     U/mL <36.00  EBV VCA IgG     U/mL 387.00 (H)  EBV NA IgG     U/mL >  600.00 (H)  Interpretation        ANA Titer 1     titer 1:640 (H)  ANA Pattern 1      Nuclear, Speckled (A)  LDH     100 - 200 U/L 153  RA Latex Turbid.     <14 IU/mL <14  Sed Rate     0 - 20 mm/hr 12   Component     Latest Ref Rng & Units 03/10/2018  Total Bilirubin     0.2 - 1.2 mg/dL 0.6  Bilirubin, Direct     0.0 - 0.3 mg/dL 0.1  Alkaline Phosphatase     39 - 117 U/L 59  AST     0 - 37 U/L 25  ALT     0 - 35 U/L 21  Total Protein     6.0 - 8.3 g/dL 7.3  Albumin     3.5 - 5.2 g/dL 4.6  TSH     1.320.35 - 4.404.50 uIU/mL 1.34  Sed Rate     0 - 20 mm/hr 34 (H)  CRP     0.5  - 20.0 mg/dL 0.3 (L)  HIV     NON-REACTI NON-REACTIVE   Imaging: Ct Cardiac Scoring  Addendum Date: 07/12/2018   ADDENDUM REPORT: 07/12/2018 12:01 CLINICAL DATA:  Risk stratification EXAM: Coronary Calcium Score TECHNIQUE: The patient was scanned on a Siemens Somatom 64 slice scanner. Axial non-contrast 3 mm slices were carried out through the heart. The data set was analyzed on a dedicated work station and scored using the Agatson method. FINDINGS: Non-cardiac: See separate report from [facility]. Ascending aorta: Normal diameter 3.1 cm Pericardium: Normal Coronary arteries: No calcium detected IMPRESSION: Coronary calcium score of 0. Charlton HawsPeter Nishan Electronically Signed   By: Charlton HawsPeter  Nishan M.D.   On: 07/12/2018 12:01   Result Date: 07/12/2018 EXAM: OVER-READ INTERPRETATION  CT CHEST The following report is an over-read performed by radiologist Dr. Signa Kellaylor Stroud of [facility] on 07/12/2018. This over-read does not include interpretation of cardiac or coronary anatomy or pathology. The coronary calcium score interpretation by the cardiologist is attached. COMPARISON:  None FINDINGS: Vascular: No significant vascular findings. Normal heart size. No pericardial effusion. Mediastinum/Nodes: No enlarged mediastinal or axillary lymph nodes. Thyroid gland, trachea, and esophagus demonstrate no significant findings. Lungs/Pleura: Lungs are clear. No pleural effusion or pneumothorax. Upper Abdomen: No acute abnormality. Musculoskeletal: No chest wall mass or suspicious bone lesions identified. IMPRESSION: Negative over-read. Electronically Signed: By: Signa Kellaylor  Stroud M.D. On: 07/12/2018 11:01    Recent Labs: Lab Results  Component Value Date   WBC 4.2 06/09/2018   HGB 14.2 06/09/2018   PLT 196.0 06/09/2018   NA 140 06/09/2018   K 4.6 06/09/2018   CL 105 06/09/2018   CO2 26 06/09/2018   GLUCOSE 79 06/09/2018   BUN 24 (H) 06/09/2018   CREATININE 0.88 06/09/2018   BILITOT 0.6  06/09/2018   ALKPHOS 65 06/09/2018   AST 16 06/09/2018   ALT 12 06/09/2018   PROT 7.4 06/09/2018   ALBUMIN 4.8 06/09/2018   CALCIUM 10.0 06/09/2018   GFRAA >60 02/11/2015    Speciality Comments: No specialty comments available.  Procedures:  No procedures performed Allergies: Patient has no known allergies.   Assessment / Plan:     Visit Diagnoses: Positive ANA (antinuclear antibody)  Hx of migraines  Endometriosis   Orders: No orders of the defined types were placed in this encounter.  No orders of the defined types  were placed in this encounter.   Face-to-face time spent with patient was *** minutes. Greater than 50% of time was spent in counseling and coordination of care.  Follow-Up Instructions: No follow-ups on file.   Ofilia Neas, PA-C  Note - This record has been created using Dragon software.  Chart creation errors have been sought, but may not always  have been located. Such creation errors do not reflect on  the standard of medical care.

## 2018-08-17 ENCOUNTER — Ambulatory Visit: Payer: Self-pay | Admitting: Rheumatology

## 2018-09-07 NOTE — Progress Notes (Signed)
Office Visit Note  Patient: [name]             Date of Birth: [birth year]           MRN: [id number]             PCP: Madelin Headings, MD Referring: Madelin Headings, MD Visit Date: 09/21/2018 Occupation: Facilities manager  Subjective:  Positive ANA.   History of Present Illness: [name] is a 43 y.o. female in consultation per request of her PCP.  According to patient her symptoms started in August 2019 with fever about 201 which was last Friday and improved.  She states they were also associated with migraine and were around her periods.  She states her last episode of fever was in October 2019 which was also associated with her menstrual cycle she felt generalized achiness and low-grade fever which lasted for 1 day.  She was having recurrent episodes of fever she went to see Dr. Fabian Sharp who did lab work and her ANA came positive.  For that reason she was referred to me.  She denies any joint swelling.  She states she does yoga regularly and has some discomfort in her right wrist.  She was evaluated by an orthopedic surgeon who suggested icing her wrist after the workout.  She also had a ganglion cyst on her right wrist.  She states she has had lower back pain for many years.  She also have sciatica symptoms which radiate to her right lower extremity.  She had MRI last year which was consistent with L5-S1 disc disease.  She denies any history of joint swelling.  There is no history of oral ulcers, nasal ulcers, sicca symptoms, oral rash, Raynaud's phenomenon.  Activities of Daily Living:  Patient reports morning stiffness for 10 minutes.   Patient Denies nocturnal pain.  Difficulty dressing/grooming: Denies Difficulty climbing stairs: Denies Difficulty getting out of chair: Denies Difficulty using hands for taps, buttons, cutlery, and/or writing: Denies  Review of Systems  Constitutional: Negative for fatigue, night sweats, weight gain and weight loss.  HENT: Negative for  mouth sores, trouble swallowing, trouble swallowing, mouth dryness and nose dryness.   Eyes: Negative for pain, redness, visual disturbance and dryness.  Respiratory: Negative for cough, shortness of breath and difficulty breathing.   Cardiovascular: Negative for chest pain, palpitations, hypertension, irregular heartbeat and swelling in legs/feet.  Gastrointestinal: Negative for blood in stool, constipation and diarrhea.  Endocrine: Negative for increased urination.  Genitourinary: Negative for vaginal dryness.  Musculoskeletal: Positive for arthralgias, joint pain and morning stiffness. Negative for joint swelling, myalgias, muscle weakness, muscle tenderness and myalgias.  Skin: Negative for color change, rash, hair loss, skin tightness, ulcers and sensitivity to sunlight.  Allergic/Immunologic: Negative for susceptible to infections.  Neurological: Negative for dizziness, memory loss, night sweats and weakness.  Hematological: Negative for swollen glands.  Psychiatric/Behavioral: Negative for depressed mood and sleep disturbance. The patient is not nervous/anxious.     PMFS History:  Patient Active Problem List   Diagnosis Date Noted  . Family history of heart disease 07/17/2018  . Intermittent FUO 06/26/2018  . Wrist joint pain 05/10/2018  . Recurrent headache 11/24/2014  . Elevated BP 11/24/2014  . Nasal obstruction 06/28/2014    Past Medical History:  Diagnosis Date  . Borderline hypertension   . Endometriosis    surgery 2009  . Frequency of urination   . Hx of varicella   . Left ureteral stone   .  Migraine   . PONV (postoperative nausea and vomiting)   . Urgency of urination     Family History  Problem Relation Age of Onset  . Hypertension Mother   . Breast cancer Mother        age 43   . Heart failure Mother        after breast cancer rx has icdalso   . Hypertension Father   . Healthy Son   . Healthy Son    Past Surgical History:  Procedure Laterality Date    . CYSTOSCOPY/RETROGRADE/URETEROSCOPY/STONE EXTRACTION WITH BASKET Left 02/13/2016   Procedure: CYSTOSCOPY/RETROGRADE/URETEROSCOPY/STONE EXTRACTION WITH BASKET;  Surgeon: Marcine MatarStephen Dahlstedt, MD;  [facility];  Service: Urology;  Laterality: Left;  . LAPAROSCOPY LYSIS ADHESIONS/  ABLATION AND BX'S OF ENDOMETRIAL LESIONS/  RIGHT FIMBROPLASTY  10-02-2007  . NASAL SEPTUM SURGERY  02/ 2016  . TONSILLECTOMY  1985   Social History   Social History Narrative   6-8 hours of sleep per night   Does not work outside home   Married for 12 years with 2 children   Children are 5335yrs and 5 yrs.   No pets   etoh social    Hot yoga  And walking    g2 p2   Born raised  [place] heritage [employer]   College degree [employer]   Immunization History  Administered Date(s) Administered  . Influenza,inj,Quad PF,6+ Mos 05/27/2018  . Influenza-Unspecified 08/09/2016  . Tdap 11/20/2014     Objective: Vital Signs: BP 123/79 (BP Location: Right Arm, Patient Position: Sitting, Cuff Size: Normal)   Pulse 69   Resp 12   Ht 5\' 4"  (1.626 m)   Wt 136 lb (61.7 kg)   BMI 23.34 kg/m    Physical Exam Vitals signs and nursing note reviewed.  Constitutional:      Appearance: She is well-developed.  HENT:     Head: Normocephalic and atraumatic.  Eyes:     Conjunctiva/sclera: Conjunctivae normal.  Neck:     Musculoskeletal: Normal range of motion.  Cardiovascular:     Rate and Rhythm: Normal rate and regular rhythm.     Heart sounds: Normal heart sounds.  Pulmonary:     Effort: Pulmonary effort is normal.     Breath sounds: Normal breath sounds.  Abdominal:     General: Bowel sounds are normal.     Palpations: Abdomen is soft.  Lymphadenopathy:     Cervical: No cervical adenopathy.  Skin:    General: Skin is warm and dry.     Capillary Refill: Capillary refill takes less than 2 seconds.  Neurological:     Mental Status: She is alert and oriented to person, place,  and time.  Psychiatric:        Behavior: Behavior normal.      Musculoskeletal Exam: C-spine thoracic lumbar spine good range of motion.  Shoulder joints elbow joints wrist joints MCPs PIPs DIPs been good range of motion.  She had a ganglion cyst on the dorsum of her right wrist.  Hip joints, knee joints, ankles, MTPs PIPs DIPs with good range of motion with no synovitis.  CDAI Exam: CDAI Score: Not documented Patient Global Assessment: Not documented; Provider Global Assessment: Not documented Swollen: Not documented; Tender: Not documented Joint Exam   Not documented   There is currently no information documented on the homunculus. Go to the Rheumatology activity and complete the homunculus joint exam.  Investigation: Findings:  06/09/18: ANA 1:640 NS, RF <14,  Sed rate 12, LDH 153, EBV VCA IgM <36, VCA IgG 387, NA IgG >600  Component     Latest Ref Rng & Units 06/09/2018  EBV VCA IgM     U/mL <36.00  EBV VCA IgG     U/mL 387.00 (H)  EBV NA IgG     U/mL >600.00 (H)  Interpretation        ANA Titer 1     titer 1:640 (H)  ANA Pattern 1      Nuclear, Speckled (A)  LDH     100 - 200 U/L 153  RA Latex Turbid.     <14 IU/mL <14  Sed Rate     0 - 20 mm/hr 12   Imaging: No results found.  Recent Labs: Lab Results  Component Value Date   WBC 4.2 06/09/2018   HGB 14.2 06/09/2018   PLT 196.0 06/09/2018   NA 140 06/09/2018   K 4.6 06/09/2018   CL 105 06/09/2018   CO2 26 06/09/2018   GLUCOSE 79 06/09/2018   BUN 24 (H) 06/09/2018   CREATININE 0.88 06/09/2018   BILITOT 0.6 06/09/2018   ALKPHOS 65 06/09/2018   AST 16 06/09/2018   ALT 12 06/09/2018   PROT 7.4 06/09/2018   ALBUMIN 4.8 06/09/2018   CALCIUM 10.0 06/09/2018   GFRAA >60 02/11/2015    Speciality Comments: No specialty comments available.  Procedures:  No procedures performed Allergies: Patient has no known allergies.   Assessment / Plan:     Visit Diagnoses: Positive ANA (antinuclear antibody) -  06/09/18: ANA 1:640 NS, RF <14, Sed rate 12, LDH 153, EBV VCA IgM <36, VCA IgG 387, NA IgG >600.  Patient reports she had episodes of fever from August till October anywhere from low-grade to 101 but no recurrence since then.  The fevers lasted only for 1 day and were mostly associated with her menstrual cycle.  She has been afebrile since then.  There is no history of joint swelling.  There is no associated features of autoimmune disease.  I will obtain AVISE labs today.  Polyarthralgia-she complains of discomfort in her right wrist but no synovitis was noted.  The discomfort in the wrist comes only while she is doing yoga.  She does have a ganglion cyst on the dorsum of her right wrist.  DDD (degenerative disc disease), lumbar-I reviewed MRI of her lumbar spine with her.  She has L5-S1 disc disease and subarticular stenosis bilaterally.  She has been doing yoga on regular basis and also doing high intensity training.  Hx of migraines-patient is a longstanding history of migraine.  Borderline hypertension-her blood pressure is under control.  Family history of heart disease  Urethral stone   Orders: No orders of the defined types were placed in this encounter.  No orders of the defined types were placed in this encounter.   Face-to-face time spent with patient was 45 minutes. Greater than 50% of time was spent in counseling and coordination of care.  Follow-Up Instructions: Return for Positive ANA.   Pollyann Savoy, MD  Note - This record has been created using Animal nutritionist.  Chart creation errors have been sought, but may not always  have been located. Such creation errors do not reflect on  the standard of medical care.

## 2018-09-14 ENCOUNTER — Ambulatory Visit: Payer: Self-pay | Admitting: Rheumatology

## 2018-09-19 DIAGNOSIS — Z8371 Family history of colonic polyps: Secondary | ICD-10-CM | POA: Diagnosis not present

## 2018-09-19 DIAGNOSIS — Z1211 Encounter for screening for malignant neoplasm of colon: Secondary | ICD-10-CM | POA: Diagnosis not present

## 2018-09-19 DIAGNOSIS — R140 Abdominal distension (gaseous): Secondary | ICD-10-CM | POA: Diagnosis not present

## 2018-09-21 ENCOUNTER — Ambulatory Visit: Payer: BLUE CROSS/BLUE SHIELD | Admitting: Rheumatology

## 2018-09-21 ENCOUNTER — Encounter: Payer: Self-pay | Admitting: Rheumatology

## 2018-09-21 VITALS — BP 123/79 | HR 69 | Resp 12 | Ht 64.0 in | Wt 136.0 lb

## 2018-09-21 DIAGNOSIS — M5136 Other intervertebral disc degeneration, lumbar region: Secondary | ICD-10-CM | POA: Diagnosis not present

## 2018-09-21 DIAGNOSIS — R768 Other specified abnormal immunological findings in serum: Secondary | ICD-10-CM | POA: Diagnosis not present

## 2018-09-21 DIAGNOSIS — Z8249 Family history of ischemic heart disease and other diseases of the circulatory system: Secondary | ICD-10-CM

## 2018-09-21 DIAGNOSIS — M2550 Pain in unspecified joint: Secondary | ICD-10-CM | POA: Diagnosis not present

## 2018-09-21 DIAGNOSIS — R030 Elevated blood-pressure reading, without diagnosis of hypertension: Secondary | ICD-10-CM

## 2018-09-21 DIAGNOSIS — Z8669 Personal history of other diseases of the nervous system and sense organs: Secondary | ICD-10-CM | POA: Diagnosis not present

## 2018-09-21 DIAGNOSIS — N211 Calculus in urethra: Secondary | ICD-10-CM

## 2018-09-26 DIAGNOSIS — L738 Other specified follicular disorders: Secondary | ICD-10-CM | POA: Diagnosis not present

## 2018-09-26 DIAGNOSIS — L2082 Flexural eczema: Secondary | ICD-10-CM | POA: Diagnosis not present

## 2018-09-26 DIAGNOSIS — L700 Acne vulgaris: Secondary | ICD-10-CM | POA: Diagnosis not present

## 2018-09-26 DIAGNOSIS — L720 Epidermal cyst: Secondary | ICD-10-CM | POA: Diagnosis not present

## 2018-10-02 DIAGNOSIS — M5136 Other intervertebral disc degeneration, lumbar region: Secondary | ICD-10-CM | POA: Insufficient documentation

## 2018-10-02 DIAGNOSIS — Z8669 Personal history of other diseases of the nervous system and sense organs: Secondary | ICD-10-CM | POA: Insufficient documentation

## 2018-10-02 DIAGNOSIS — N201 Calculus of ureter: Secondary | ICD-10-CM | POA: Insufficient documentation

## 2018-10-02 NOTE — Progress Notes (Signed)
Office Visit Note  Patient: [name]             Date of Birth: 04/25/1976           MRN: [id number]             PCP: Madelin Headings, MD Referring: Madelin Headings, MD Visit Date: 10/16/2018 Occupation: @GUAROCC @  Subjective:  Pain in bilateral wrist.   History of Present Illness: [name] is a 43 y.o. female with history of arthralgias.  She states she continues to have pain in her bilateral wrist while she does yoga.  She has a ganglion cyst on the dorsum of her right wrist which bothers her.  None of the other joints are painful.  She states she has chronic lower back pain which is still bothers her.  There is no history of dry mouth and dry eyes.  Activities of Daily Living:  Patient reports morning stiffness for 5 minutes.   Patient Denies nocturnal pain.  Difficulty dressing/grooming: Denies Difficulty climbing stairs: Denies Difficulty getting out of chair: Denies Difficulty using hands for taps, buttons, cutlery, and/or writing: Denies  Review of Systems  Constitutional: Negative for fatigue, night sweats, weight gain and weight loss.  HENT: Negative for mouth sores, trouble swallowing, trouble swallowing, mouth dryness and nose dryness.   Eyes: Negative for pain, redness, visual disturbance and dryness.  Respiratory: Negative for cough, shortness of breath and difficulty breathing.   Cardiovascular: Positive for palpitations. Negative for chest pain, hypertension, irregular heartbeat and swelling in legs/feet.  Gastrointestinal: Negative for blood in stool, constipation and diarrhea.  Endocrine: Negative for increased urination.  Genitourinary: Negative for vaginal dryness.  Musculoskeletal: Positive for arthralgias, joint pain and morning stiffness. Negative for joint swelling, myalgias, muscle weakness, muscle tenderness and myalgias.  Skin: Negative for color change, rash, hair loss, skin tightness, ulcers and sensitivity to sunlight.  Allergic/Immunologic:  Negative for susceptible to infections.  Neurological: Negative for dizziness, memory loss, night sweats and weakness.  Hematological: Negative for swollen glands.  Psychiatric/Behavioral: Negative for depressed mood and sleep disturbance. The patient is not nervous/anxious.     PMFS History:  Patient Active Problem List   Diagnosis Date Noted  . Ureteral stone 10/02/2018  . DDD (degenerative disc disease), lumbar 10/02/2018  . History of migraine 10/02/2018  . Family history of heart disease 07/17/2018  . Intermittent FUO 06/26/2018  . Wrist joint pain 05/10/2018  . Recurrent headache 11/24/2014  . Elevated BP 11/24/2014  . Nasal obstruction 06/28/2014    Past Medical History:  Diagnosis Date  . Borderline hypertension   . Endometriosis    surgery 2009  . Frequency of urination   . Hx of varicella   . Left ureteral stone   . Migraine   . PONV (postoperative nausea and vomiting)   . Urgency of urination     Family History  Problem Relation Age of Onset  . Hypertension Mother   . Breast cancer Mother        age 38   . Heart failure Mother        after breast cancer rx has icdalso   . Hypertension Father   . Healthy Son   . Healthy Son    Past Surgical History:  Procedure Laterality Date  . COLONOSCOPY  10/11/2018  . CYSTOSCOPY/RETROGRADE/URETEROSCOPY/STONE EXTRACTION WITH BASKET Left 02/13/2016   Procedure: CYSTOSCOPY/RETROGRADE/URETEROSCOPY/STONE EXTRACTION WITH BASKET;  Surgeon: Marcine Matar, MD;  Location: [facility];  Service: Urology;  Laterality: Left;  . LAPAROSCOPY LYSIS ADHESIONS/  ABLATION AND BX'S OF ENDOMETRIAL LESIONS/  RIGHT FIMBROPLASTY  10-02-2007  . NASAL SEPTUM SURGERY  02/ 2016  . TONSILLECTOMY  1985   Social History   Social History Narrative   6-8 hours of sleep per night   Does not work outside home   Married for 12 years with 2 children   Children are 47yrs and 5 yrs.   No pets   etoh social    Hot yoga  And walking     g2 p2   Born raised  [place] [employer] degree [employer] [place]   Immunization History  Administered Date(s) Administered  . Influenza,inj,Quad PF,6+ Mos 05/27/2018  . Influenza-Unspecified 08/09/2016  . Tdap 11/20/2014     Objective: Vital Signs: BP (!) 146/93 (BP Location: Left Arm, Patient Position: Sitting, Cuff Size: Normal)   Pulse 73   Resp 12   Ht 5\' 4"  (1.626 m)   Wt 134 lb 9.6 oz (61.1 kg)   BMI 23.10 kg/m    Physical Exam Vitals signs and nursing note reviewed.  Constitutional:      Appearance: She is well-developed.  HENT:     Head: Normocephalic and atraumatic.  Eyes:     Conjunctiva/sclera: Conjunctivae normal.  Neck:     Musculoskeletal: Normal range of motion.  Cardiovascular:     Rate and Rhythm: Normal rate and regular rhythm.     Heart sounds: Normal heart sounds.  Pulmonary:     Effort: Pulmonary effort is normal.     Breath sounds: Normal breath sounds.  Abdominal:     General: Bowel sounds are normal.     Palpations: Abdomen is soft.  Lymphadenopathy:     Cervical: No cervical adenopathy.  Skin:    General: Skin is warm and dry.     Capillary Refill: Capillary refill takes less than 2 seconds.  Neurological:     Mental Status: She is alert and oriented to person, place, and time.  Psychiatric:        Behavior: Behavior normal.      Musculoskeletal Exam: C-spine thoracic spine good range of motion.  She has some discomfort range of motion of her lumbar spine.  Shoulder joints, elbow joints, wrist joints, MCPs PIPs DIPs been good range of motion with no synovitis.  She has ganglion cyst on the dorsum of her right wrist.  Hip joints, knee joints, ankles, MTPs PIPs were in good range of motion with no synovitis.  CDAI Exam: CDAI Score: Not documented Patient Global Assessment: Not documented; Provider Global Assessment: Not documented Swollen: Not documented; Tender: Not documented Joint Exam   Not documented    There is currently no information documented on the homunculus. Go to the Rheumatology activity and complete the homunculus joint exam.  Investigation: No additional findings.  Imaging: No results found.  Recent Labs: Lab Results  Component Value Date   WBC 4.2 06/09/2018   HGB 14.2 06/09/2018   PLT 196.0 06/09/2018   NA 140 06/09/2018   K 4.6 06/09/2018   CL 105 06/09/2018   CO2 26 06/09/2018   GLUCOSE 79 06/09/2018   BUN 24 (H) 06/09/2018   CREATININE 0.88 06/09/2018   BILITOT 0.6 06/09/2018   ALKPHOS 65 06/09/2018   AST 16 06/09/2018   ALT 12 06/09/2018   PROT 7.4 06/09/2018   ALBUMIN 4.8 06/09/2018   CALCIUM 10.0 06/09/2018   GFRAA >60 02/11/2015  September 21, 2018 AVISE  index -0.9, ANA 1: 320 speckled, anti-Ro positive, anti-La positive, (dsDNA, RNP, SCL 70, centromere, Jo 1, anticardiolipin, beta-2 negative),CB-CAP Positive, RF equivocal, anti-CCP negative, antithyroglobulin negative, anti-TPO negative  Speciality Comments: No specialty comments available.  Procedures:  No procedures performed Allergies: Patient has no known allergies.   Assessment / Plan:     Visit Diagnoses: Positive ANA (antinuclear antibody) - ANA 1: 320 speckled, positive Ro, positive anti-La.  History of episodic fever.  Patient has no sicca symptoms.  I discussed that positive Ro antibody could be associated with arrhythmias.  She will get EKG through her PCP.  At this time she is asymptomatic.  Have advised her to contact me if she develops any new symptoms.  Have given her information on Sjogren's.  Polyarthralgia - History of wrist pain and ganglion cyst over bilateral wrist joints.  None of the other joints are painful.  No synovitis was noted on examination.  I offered referral for ganglion cyst resection but she declined.  She states her wrist joints bother only when she is doing yoga classes.  DDD (degenerative disc disease), lumbar-she had x-rays and MRI in the past.  History of  migraine  Elevated blood pressure reading-I have advised her to monitor her blood pressure closely.  Family history of heart disease-congestive heart failure in her mother  Ureteral stone   Orders: No orders of the defined types were placed in this encounter.  No orders of the defined types were placed in this encounter.    Follow-Up Instructions: Return in about 1 year (around 10/16/2019) for +ANA, +Ro,+La.   Pollyann Savoy, MD  Note - This record has been created using Animal nutritionist.  Chart creation errors have been sought, but may not always  have been located. Such creation errors do not reflect on  the standard of medical care.

## 2018-10-11 ENCOUNTER — Ambulatory Visit: Payer: Self-pay | Admitting: Rheumatology

## 2018-10-11 DIAGNOSIS — Z1211 Encounter for screening for malignant neoplasm of colon: Secondary | ICD-10-CM | POA: Diagnosis not present

## 2018-10-11 DIAGNOSIS — Z01818 Encounter for other preprocedural examination: Secondary | ICD-10-CM | POA: Diagnosis not present

## 2018-10-11 HISTORY — PX: COLONOSCOPY: SHX174

## 2018-10-11 LAB — HM COLONOSCOPY

## 2018-10-16 ENCOUNTER — Encounter: Payer: Self-pay | Admitting: Rheumatology

## 2018-10-16 ENCOUNTER — Telehealth: Payer: Self-pay | Admitting: Rheumatology

## 2018-10-16 ENCOUNTER — Ambulatory Visit: Payer: BLUE CROSS/BLUE SHIELD | Admitting: Rheumatology

## 2018-10-16 VITALS — BP 146/93 | HR 73 | Resp 12 | Ht 64.0 in | Wt 134.6 lb

## 2018-10-16 DIAGNOSIS — N201 Calculus of ureter: Secondary | ICD-10-CM

## 2018-10-16 DIAGNOSIS — M5136 Other intervertebral disc degeneration, lumbar region: Secondary | ICD-10-CM

## 2018-10-16 DIAGNOSIS — Z8669 Personal history of other diseases of the nervous system and sense organs: Secondary | ICD-10-CM

## 2018-10-16 DIAGNOSIS — R768 Other specified abnormal immunological findings in serum: Secondary | ICD-10-CM

## 2018-10-16 DIAGNOSIS — M2550 Pain in unspecified joint: Secondary | ICD-10-CM

## 2018-10-16 DIAGNOSIS — Z8249 Family history of ischemic heart disease and other diseases of the circulatory system: Secondary | ICD-10-CM

## 2018-10-16 DIAGNOSIS — R030 Elevated blood-pressure reading, without diagnosis of hypertension: Secondary | ICD-10-CM

## 2018-10-16 NOTE — Telephone Encounter (Signed)
Patient advised that her new patient follow up is necessary as we need to discuss her AVISE lab results with her.

## 2018-10-16 NOTE — Patient Instructions (Addendum)
Please schedule EKG with your PCP rule out arrhythmia as you have positive Ro antibody  Sjogren Syndrome  Sjogren syndrome is a disease in which the body's disease-fighting system (immune system) attacks the glands that produce tears (lacrimal glands) and the glands that produce saliva (salivary glands). This makes the eyes and mouth very dry. Sjogren syndrome is a long-term (chronic) disorder that has no cure. In some cases, it is linked to other disorders (rheumatic disorders), such as rheumatoid arthritis and systemic lupus erythematosus (SLE). It may affect other parts of the body, such as:  Kidneys.  Blood vessels.  Joints.  Lungs.  Liver.  Pancreas.  Brain.  Nerves.  Spinal cord. What are the causes? The cause of this condition is not known. It may be passed along from parent to child (inherited), or it may be a symptom of a rheumatic disorder. What increases the risk? This condition is more likely to develop in:  Women.  People who are 46-44 years old.  People who have recently had a viral infection or currently have a viral infection. What are the signs or symptoms? The main symptoms of this condition are:  Dry mouth. This may include: ? A chalky feeling. ? Difficulty swallowing, speaking, or tasting. ? Frequent cavities in teeth. ? Frequent mouth infections.  Dry eyes. This may include: ? Burning, redness, and itching. ? Blurry vision. ? Light sensitivity. Other symptoms may include:  Dryness of the skin and the inside of the nose.  Eyelid infections.  Vaginal dryness, if this applies.  Joint pain and stiffness.  Muscle pain and stiffness. How is this diagnosed? This condition is diagnosed based on:  Your symptoms.  Your medical history.  A physical exam of your eyes and mouth. You may have tests, including:  Schirmer test. This tests your tear production.  An eye exam that is done with a magnifying device (slit-lamp exam).  An eye  test that temporarily stains your eye with dye. This shows the extent of eye damage.  Tests to check your salivary gland function.  Biopsy. This is a removal of part of a salivary gland from inside your lower lip to be studied under a microscope.  Chest X-rays.  Blood tests.  Urine tests. How is this treated? There is no cure for this condition, but treatment can help you manage your symptoms. Treatment options may include:  Moisture replacement therapies to help relieve dryness in your skin, mouth, and eyes.  NSAIDs to help relieve pain and stiffness.  Medicines to help relieve inflammation in your body(corticosteroids). These are usually for severe cases.  Medicines to help reduce the activity of your immune system (immunosuppressants).  Surgery or insertion of plugs to close the lacrimal glands (punctal occlusion). Thishelps keep more natural tears in your eyes. Follow these instructions at home:  Take over-the-counter and prescription medicines only as told by your health care provider.  Take these actions to care for your eyes: ? Use eye drops as told by your health care provider. ? Blink at least 5-6 times a minute. ? Protect your eyes from drafts and breezes. ? Maintain properly humidified air. You may want to use a humidifier at home. ? Avoid smoke.  Take these actions to care for your mouth: ? Brush your teeth and floss after every meal. ? Chew sugar-free gum or suck on hard candy. For some people, this can help to relieve dry mouth. ? Use antimicrobial mouthwash daily. ? Take frequent sips of water or sugar-free  drinks. ? Use saliva substitutes or lip balm as told by your health care provider.  Drink enough fluid to keep your urine clear or pale yellow.  Schedule and attend dentist visits every six months.  Keep all follow-up visits as told by your health care provider. This is important. Contact a health care provider if:  You have a fever.  You have  night sweats.  You are always tired.  You have unexplained weight loss.  You develop itchy skin.  You have red patches on your skin.  You have a lump or swelling on your neck. This information is not intended to replace advice given to you by your health care provider. Make sure you discuss any questions you have with your health care provider. Document Released: 07/16/2002 Document Revised: 03/21/2016 Document Reviewed: 04/03/2015 Elsevier Interactive Patient Education  2019 [employer].

## 2018-10-16 NOTE — Telephone Encounter (Signed)
Patient called stating she is scheduled for a NPT FU appointment today at 11:00 and requests a return call before cancelling it.  Patient "has a busy schedule and doesn't believe the appointment is necessary."

## 2019-01-16 DIAGNOSIS — Z91018 Allergy to other foods: Secondary | ICD-10-CM | POA: Diagnosis not present

## 2019-01-16 DIAGNOSIS — L508 Other urticaria: Secondary | ICD-10-CM | POA: Diagnosis not present

## 2019-01-16 DIAGNOSIS — T7840XA Allergy, unspecified, initial encounter: Secondary | ICD-10-CM | POA: Diagnosis not present

## 2019-01-31 DIAGNOSIS — L508 Other urticaria: Secondary | ICD-10-CM | POA: Diagnosis not present

## 2019-01-31 DIAGNOSIS — Z9229 Personal history of other drug therapy: Secondary | ICD-10-CM | POA: Diagnosis not present

## 2019-02-13 DIAGNOSIS — Z6823 Body mass index (BMI) 23.0-23.9, adult: Secondary | ICD-10-CM | POA: Diagnosis not present

## 2019-02-13 DIAGNOSIS — Z1231 Encounter for screening mammogram for malignant neoplasm of breast: Secondary | ICD-10-CM | POA: Diagnosis not present

## 2019-02-13 DIAGNOSIS — Z01419 Encounter for gynecological examination (general) (routine) without abnormal findings: Secondary | ICD-10-CM | POA: Diagnosis not present

## 2019-02-13 DIAGNOSIS — Z124 Encounter for screening for malignant neoplasm of cervix: Secondary | ICD-10-CM | POA: Diagnosis not present

## 2019-02-13 LAB — HM PAP SMEAR

## 2019-02-15 ENCOUNTER — Other Ambulatory Visit: Payer: Self-pay | Admitting: Obstetrics and Gynecology

## 2019-02-15 DIAGNOSIS — R928 Other abnormal and inconclusive findings on diagnostic imaging of breast: Secondary | ICD-10-CM

## 2019-02-20 ENCOUNTER — Ambulatory Visit
Admission: RE | Admit: 2019-02-20 | Discharge: 2019-02-20 | Disposition: A | Discharge status: Home / Self Care | Payer: BLUE CROSS/BLUE SHIELD | Source: Ambulatory Visit | Attending: Obstetrics and Gynecology | Admitting: Obstetrics and Gynecology

## 2019-02-20 ENCOUNTER — Encounter: Payer: Self-pay | Admitting: Internal Medicine

## 2019-02-20 ENCOUNTER — Other Ambulatory Visit: Payer: Self-pay

## 2019-02-20 DIAGNOSIS — R928 Other abnormal and inconclusive findings on diagnostic imaging of breast: Secondary | ICD-10-CM

## 2019-02-20 DIAGNOSIS — R922 Inconclusive mammogram: Secondary | ICD-10-CM | POA: Diagnosis not present

## 2019-02-20 DIAGNOSIS — N6489 Other specified disorders of breast: Secondary | ICD-10-CM | POA: Diagnosis not present

## 2019-02-23 DIAGNOSIS — Z30433 Encounter for removal and reinsertion of intrauterine contraceptive device: Secondary | ICD-10-CM | POA: Diagnosis not present

## 2019-02-23 DIAGNOSIS — Z6823 Body mass index (BMI) 23.0-23.9, adult: Secondary | ICD-10-CM | POA: Diagnosis not present

## 2019-02-26 ENCOUNTER — Encounter: Payer: Self-pay | Admitting: Internal Medicine

## 2019-04-04 DIAGNOSIS — Z30431 Encounter for routine checking of intrauterine contraceptive device: Secondary | ICD-10-CM | POA: Diagnosis not present

## 2019-04-04 DIAGNOSIS — Z6823 Body mass index (BMI) 23.0-23.9, adult: Secondary | ICD-10-CM | POA: Diagnosis not present

## 2019-05-30 DIAGNOSIS — L308 Other specified dermatitis: Secondary | ICD-10-CM | POA: Diagnosis not present

## 2019-06-01 ENCOUNTER — Telehealth (INDEPENDENT_AMBULATORY_CARE_PROVIDER_SITE_OTHER): Payer: BC Managed Care – PPO | Admitting: Internal Medicine

## 2019-06-01 ENCOUNTER — Other Ambulatory Visit: Payer: Self-pay

## 2019-06-01 ENCOUNTER — Encounter: Payer: Self-pay | Admitting: Internal Medicine

## 2019-06-01 DIAGNOSIS — Z8669 Personal history of other diseases of the nervous system and sense organs: Secondary | ICD-10-CM

## 2019-06-01 DIAGNOSIS — G43009 Migraine without aura, not intractable, without status migrainosus: Secondary | ICD-10-CM

## 2019-06-01 DIAGNOSIS — Z79899 Other long term (current) drug therapy: Secondary | ICD-10-CM

## 2019-06-01 MED ORDER — ZOLMITRIPTAN 2.5 MG PO TABS: 2.5000 mg | ORAL_TABLET | ORAL | 2 refills | Status: DC | PRN

## 2019-06-01 NOTE — Progress Notes (Signed)
Virtual Visit via Video Note  I connected with@ on 06/01/19 at  1:30 PM EDT by a video enabled telemedicine application and verified that I am speaking with the correct person using two identifiers. Location patient: home Location provider:work office Persons participating in the virtual visit: patient, provider  [place]th national recommendations  regarding COVID 19 pandemic   video visit is advised over in office visit for this patient.  Patient aware  of the limitations of evaluation and management by telemedicine and  availability of in person appointments. and agreed to proceed.   HPI: [name] presents for video visit she has a history of recurrent migraine headaches remote history of ophthalmic migraines but no classic migraines at this time.  She will take some Aleve and Tylenol but for full-blown headaches taking Zomig usually 1 dose is good enough she is having them a little more frequently recently needs a refill.  Her last pattern was about 3 times a month. She knows her triggers which include.  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  Etc.  She had evaluation by Dr. Ames Dura year for positive ANA etc. may be seen on a yearly basis. She states that her blood pressures been okay has been checked recently.  ROS: See pertinent positives and negatives per HPI.  Past Medical History:  Diagnosis Date  . Borderline hypertension   . Endometriosis    surgery 2009  . Frequency of urination   . Hx of varicella   . Left ureteral stone   . Migraine   . PONV (postoperative nausea and vomiting)   . Urgency of urination     Past Surgical History:  Procedure Laterality Date  . COLONOSCOPY  10/11/2018  . CYSTOSCOPY/RETROGRADE/URETEROSCOPY/STONE EXTRACTION WITH BASKET Left 02/13/2016   Procedure: CYSTOSCOPY/RETROGRADE/URETEROSCOPY/STONE EXTRACTION WITH BASKET;  Surgeon: Franchot Gallo, MD;  Location[facility];  Service: Urology;  Laterality: Left;  .  LAPAROSCOPY LYSIS ADHESIONS/  ABLATION AND BX'S OF ENDOMETRIAL LESIONS/  RIGHT FIMBROPLASTY  10-02-2007  . NASAL SEPTUM SURGERY  02/ 2016  . TONSILLECTOMY  1985    Family History  Problem Relation Age of Onset  . Hypertension Mother   . Breast cancer Mother        age 69   . Heart failure Mother        after breast cancer rx has icdalso   . Hypertension Father   . Healthy Son   . Healthy Son     Social History   Tobacco Use  . Smoking status: Never Smoker  . Smokeless tobacco: Never Used  Substance Use Topics  . Alcohol use: Yes    Alcohol/week: 0.0 standard drinks    Comment: social  . Drug use: No      Current Outpatient Medications:  .  Biotin 5000 MCG CAPS, Take 1 capsule by mouth daily. , Disp: , Rfl:  .  Collagen Hydrolysate POWD, 1 Scoop daily, Disp: , Rfl:  .  Diclofenac Potassium (ZIPSOR) 25 MG CAPS, Take 1 every 8 hours as needed for migraine headache., Disp: 30 capsule, Rfl: 2 .  levonorgestrel (MIRENA) 20 MCG/24HR IUD, by Intrauterine route., Disp: , Rfl:  .  Magnesium 400 MG CAPS, Take 1 tablet by mouth daily., Disp: , Rfl:  .  Multiple Vitamins-Minerals (HAIR VITAMINS) TABS, Take 1 tablet by mouth daily., Disp: , Rfl:  .  Naproxen Sodium (ALEVE) 220 MG CAPS, Take by mouth as needed., Disp: , Rfl:  .  Omega-3 Fatty Acids (  FISH OIL) 1200 MG CAPS, Take 1 capsule by mouth daily. , Disp: , Rfl:  .  Probiotic Product (PROBIOTIC DAILY) CAPS, Take 1 capsule by mouth daily., Disp: , Rfl:  .  ZOLMitriptan (ZOMIG) 2.5 MG tablet, Take 1 tablet (2.5 mg total) by mouth as needed for migraine., Disp: 10 tablet, Rfl: 2  EXAM: BP Readings from Last 3 Encounters:  10/16/18 (!) 146/93  09/21/18 123/79  07/17/18 118/78    VITALS per patient if applicable: Blood pressure reported as normal. 118/72 at her GYNE  GENERAL: alert, oriented, appears well and in no acute distress  HEENT: atraumatic, conjunttiva clear, no obvious abnormalities on inspection of external nose and  ears NECK: normal movements of the head and neck LUNGS: on inspection no signs of respiratory distress, breathing rate appears normal, no obvious gross SOB, gasping or wheezing CV: no obvious cyanosis PSYCH/NEURO: pleasant and cooperative, no obvious depression or anxiety, speech and thought processing grossly intact Lab Results  Component Value Date   WBC 4.2 06/09/2018   HGB 14.2 06/09/2018   HCT 41.3 06/09/2018   PLT 196.0 06/09/2018   GLUCOSE 79 06/09/2018   CHOL 144 11/01/2017   TRIG 51.0 11/01/2017   HDL 52.80 11/01/2017   LDLCALC 81 11/01/2017   ALT 12 06/09/2018   AST 16 06/09/2018   NA 140 06/09/2018   K 4.6 06/09/2018   CL 105 06/09/2018   CREATININE 0.88 06/09/2018   BUN 24 (H) 06/09/2018   CO2 26 06/09/2018   TSH 1.34 03/10/2018    ASSESSMENT AND PLAN:  Discussed the following assessment and plan:    ICD-10-CM   1. Medication management  Z79.899   2. History of migraine  Z86.69   3. Migraine without aura and without status migrainosus, not intractable  G43.009    At this time continue same medicines can take 2 or 2-1/2 laleve at the onset of a headache that is not full-blown see if she gets medication.  She has about new medicine at this time will remain on same regimen. Discussed triggers control tracking. Get back with [place] if persistent progressive increase frequency of headaches.  She has cut out most caffeine. Counseled.   Expectant management and discussion of plan and treatment with opportunity to ask questions and all were answered. The patient agreed with the plan and demonstrated an understanding of the instructions.   Advised to call back or seek an in-person evaluation if worsening  or having  further concerns .    Berniece Andreas, MD

## 2019-07-30 DIAGNOSIS — Z03818 Encounter for observation for suspected exposure to other biological agents ruled out: Secondary | ICD-10-CM | POA: Diagnosis not present

## 2019-10-09 NOTE — Progress Notes (Deleted)
Office Visit Note  Patient: [name]             Date of Birth: Jun 24, 1976           MRN: [id number]             PCP: Madelin Headings, MD Referring: Madelin Headings, MD Visit Date: 10/16/2019 Occupation: @GUAROCC @  Subjective:  No chief complaint on file.   History of Present Illness: [name] is a 44 y.o. female ***   Activities of Daily Living:  Patient reports morning stiffness for *** {minute/hour:19697}.   Patient {ACTIONS;DENIES/REPORTS:21021675::"Denies"} nocturnal pain.  Difficulty dressing/grooming: {ACTIONS;DENIES/REPORTS:21021675::"Denies"} Difficulty climbing stairs: {ACTIONS;DENIES/REPORTS:21021675::"Denies"} Difficulty getting out of chair: {ACTIONS;DENIES/REPORTS:21021675::"Denies"} Difficulty using hands for taps, buttons, cutlery, and/or writing: {ACTIONS;DENIES/REPORTS:21021675::"Denies"}  No Rheumatology ROS completed.   PMFS History:  Patient Active Problem List   Diagnosis Date Noted  . Ureteral stone 10/02/2018  . DDD (degenerative disc disease), lumbar 10/02/2018  . History of migraine 10/02/2018  . Family history of heart disease 07/17/2018  . Intermittent FUO 06/26/2018  . Wrist joint pain 05/10/2018  . Recurrent headache 11/24/2014  . Elevated BP 11/24/2014  . Nasal obstruction 06/28/2014    Past Medical History:  Diagnosis Date  . Borderline hypertension   . Endometriosis    surgery 2009  . Frequency of urination   . Hx of varicella   . Left ureteral stone   . Migraine   . PONV (postoperative nausea and vomiting)   . Urgency of urination     Family History  Problem Relation Age of Onset  . Hypertension Mother   . Breast cancer Mother        age 67   . Heart failure Mother        after breast cancer rx has icdalso   . Hypertension Father   . Healthy Son   . Healthy Son    Past Surgical History:  Procedure Laterality Date  . COLONOSCOPY  10/11/2018  . CYSTOSCOPY/RETROGRADE/URETEROSCOPY/STONE EXTRACTION  WITH BASKET Left 02/13/2016   Procedure: CYSTOSCOPY/RETROGRADE/URETEROSCOPY/STONE EXTRACTION WITH BASKET;  Surgeon: 04/15/2016, MD;  Location: [facility];  Service: Urology;  Laterality: Left;  . LAPAROSCOPY LYSIS ADHESIONS/  ABLATION AND BX'S OF ENDOMETRIAL LESIONS/  RIGHT FIMBROPLASTY  10-02-2007  . NASAL SEPTUM SURGERY  02/ 2016  . TONSILLECTOMY  1985   Social History   Social History Narrative   6-8 hours of sleep per night   Does not work outside home   Married for 12 years with 2 children   Children are 64yrs and 5 yrs.   No pets   etoh social    Hot yoga  And walking    g2 p2   Born raised  [place] heritage 4yr degree un[employer]   Immunization History  Administered Date(s) Administered  . Influenza,inj,Quad PF,6+ Mos 05/27/2018  . Influenza-Unspecified 08/09/2016  . Tdap 11/20/2014     Objective: Vital Signs: There were no vitals taken for this visit.   Physical Exam   Musculoskeletal Exam: ***  CDAI Exam: CDAI Score: -- Patient Global: --; Provider Global: -- Swollen: --; Tender: -- Joint Exam 10/16/2019   No joint exam has been documented for this visit   There is currently no information documented on the homunculus. Go to the Rheumatology activity and complete the homunculus joint exam.  Investigation: No additional findings.  Imaging: No results found.  Recent Labs: Lab Results  Component Value Date  WBC 4.2 06/09/2018   HGB 14.2 06/09/2018   PLT 196.0 06/09/2018   NA 140 06/09/2018   K 4.6 06/09/2018   CL 105 06/09/2018   CO2 26 06/09/2018   GLUCOSE 79 06/09/2018   BUN 24 (H) 06/09/2018   CREATININE 0.88 06/09/2018   BILITOT 0.6 06/09/2018   ALKPHOS 65 06/09/2018   AST 16 06/09/2018   ALT 12 06/09/2018   PROT 7.4 06/09/2018   ALBUMIN 4.8 06/09/2018   CALCIUM 10.0 06/09/2018   GFRAA >60 02/11/2015    Speciality Comments: No specialty comments available.  Procedures:  No  procedures performed Allergies: Patient has no known allergies.   Assessment / Plan:     Visit Diagnoses: No diagnosis found.  Orders: No orders of the defined types were placed in this encounter.  No orders of the defined types were placed in this encounter.   Face-to-face time spent with patient was *** minutes. Greater than 50% of time was spent in counseling and coordination of care.  Follow-Up Instructions: No follow-ups on file.   Ofilia Neas, PA-C  Note - This record has been created using Dragon software.  Chart creation errors have been sought, but may not always  have been located. Such creation errors do not reflect on  the standard of medical care.

## 2019-10-16 ENCOUNTER — Ambulatory Visit: Payer: Self-pay | Admitting: Rheumatology

## 2019-10-19 NOTE — Progress Notes (Signed)
Office Visit Note  Patient: [name]             Date of Birth: [birth year]           MRN: [id number]             PCP: Burnis Medin, MD Referring: Burnis Medin, MD Visit Date: 10/26/2019 Occupation: @GUAROCC @  Subjective:  Positive ANA  History of Present Illness: [name] is a 44 y.o. female with history of positive ANA, polyarthralgia, and DDD.  She denies any joint pain or joint swelling at this time.  She denies any lower back pain but occasionally will have sciatica after overuse activities or exercise.  She denies any recent rashes, photosensitivity, hair loss, symptoms of Raynaud's, oral nasal ulcerations, sicca symptoms, enlarged lymph nodes, chest pain, or shortness of breath.  She denies any new concerns at this time.  She continues to take turmeric as well as a collagen supplement.  She is exercising on a regular basis without difficulty.  Activities of Daily Living:  Patient reports morning stiffness for 1 minute.   Patient Denies nocturnal pain.  Difficulty dressing/grooming: Denies Difficulty climbing stairs: Denies Difficulty getting out of chair: Denies Difficulty using hands for taps, buttons, cutlery, and/or writing: Denies  Review of Systems  Constitutional: Negative for fatigue.  HENT: Negative for mouth sores, mouth dryness and nose dryness.   Eyes: Negative for pain, visual disturbance and dryness.  Respiratory: Negative for cough, hemoptysis, shortness of breath and difficulty breathing.   Cardiovascular: Negative for chest pain, palpitations, hypertension and swelling in legs/feet.  Gastrointestinal: Negative for blood in stool, constipation and diarrhea.  Endocrine: Negative for excessive thirst and increased urination.  Genitourinary: Negative for difficulty urinating and painful urination.  Musculoskeletal: Negative for arthralgias, joint pain, joint swelling, myalgias, muscle weakness, morning stiffness, muscle tenderness and  myalgias.  Skin: Negative for color change, pallor, rash, hair loss, nodules/bumps, skin tightness, ulcers and sensitivity to sunlight.  Allergic/Immunologic: Negative for susceptible to infections.  Neurological: Negative for dizziness, numbness, headaches and weakness.  Hematological: Negative for bruising/bleeding tendency and swollen glands.  Psychiatric/Behavioral: Negative for depressed mood and sleep disturbance. The patient is not nervous/anxious.     PMFS History:  Patient Active Problem List   Diagnosis Date Noted   Ureteral stone 10/02/2018   DDD (degenerative disc disease), lumbar 10/02/2018   History of migraine 10/02/2018   Family history of heart disease 07/17/2018   Intermittent FUO 06/26/2018   Wrist joint pain 05/10/2018   Recurrent headache 11/24/2014   Elevated BP 11/24/2014   Nasal obstruction 06/28/2014    Past Medical History:  Diagnosis Date   Borderline hypertension    Endometriosis    surgery 2009   Frequency of urination    Hx of varicella    Left ureteral stone    Migraine    PONV (postoperative nausea and vomiting)    Urgency of urination     Family History  Problem Relation Age of Onset   Hypertension Mother    Breast cancer Mother        age 47    Heart failure Mother        after breast cancer rx has icdalso    Hypertension Father    Healthy Son    Healthy Son    Past Surgical History:  Procedure Laterality Date   COLONOSCOPY  10/11/2018   CYSTOSCOPY/RETROGRADE/URETEROSCOPY/STONE EXTRACTION WITH BASKET Left 02/13/2016   Procedure: CYSTOSCOPY/RETROGRADE/URETEROSCOPY/STONE EXTRACTION WITH BASKET;  Surgeon: Marcine Matar, MD;  Location: [facility];  Service: Urology;  Laterality: Left;   LAPAROSCOPY LYSIS ADHESIONS/  ABLATION AND BX'S OF ENDOMETRIAL LESIONS/  RIGHT FIMBROPLASTY  10-02-2007   NASAL SEPTUM SURGERY  02/ 2016   TONSILLECTOMY  1985   Social History   Social History Narrative     6-8 hours of sleep per night   Does not work outside home   Married for 12 years with 2 children   Children are 80yrs and 5 yrs.   No pets   etoh social    Hot yoga  And walking    g2 p2   Born raised  [place]   College degree [employer] [place]   Immunization History  Administered Date(s) Administered   Influenza,inj,Quad PF,6+ Mos 05/27/2018   Influenza-Unspecified 08/09/2016   Tdap 11/20/2014     Objective: Vital Signs: BP 137/77 (BP Location: Right Arm, Patient Position: Sitting, Cuff Size: Normal)    Pulse 72    Resp 14    Ht 5\' 4"  (1.626 m)    Wt 139 lb (63 kg)    BMI 23.86 kg/m    Physical Exam Vitals and nursing note reviewed.  Constitutional:      Appearance: She is well-developed.  HENT:     Head: Normocephalic and atraumatic.  Eyes:     Conjunctiva/sclera: Conjunctivae normal.  Pulmonary:     Effort: Pulmonary effort is normal.  Abdominal:     General: Bowel sounds are normal.     Palpations: Abdomen is soft.  Musculoskeletal:     Cervical back: Normal range of motion.  Lymphadenopathy:     Cervical: No cervical adenopathy.  Skin:    General: Skin is warm and dry.     Capillary Refill: Capillary refill takes less than 2 seconds.  Neurological:     Mental Status: She is alert and oriented to person, place, and time.  Psychiatric:        Behavior: Behavior normal.      Musculoskeletal Exam: C-spine, thoracic spine, lumbar spine good range of motion.  No midline spinal tenderness.  No SI joint tenderness.  Shoulder joints, elbow joints, wrist joints, MCPs, PIPs, DIPs good range of motion with no synovitis.  She has complete fist formation bilaterally.  Hip joints, knee joints, ankle joints, MTPs, PIPs and DIPs good range of motion with no synovitis.  No warmth or effusion of bilateral knee joints.  No tenderness or swelling of ankle joints.  No tenderness over trochanteric bursa bilaterally.  CDAI Exam: CDAI Score: -- Patient  Global: --; Provider Global: -- Swollen: --; Tender: -- Joint Exam 10/26/2019   No joint exam has been documented for this visit   There is currently no information documented on the homunculus. Go to the Rheumatology activity and complete the homunculus joint exam.  Investigation: No additional findings.  Imaging: No results found.  Recent Labs: Lab Results  Component Value Date   WBC 4.2 06/09/2018   HGB 14.2 06/09/2018   PLT 196.0 06/09/2018   NA 140 06/09/2018   K 4.6 06/09/2018   CL 105 06/09/2018   CO2 26 06/09/2018   GLUCOSE 79 06/09/2018   BUN 24 (H) 06/09/2018   CREATININE 0.88 06/09/2018   BILITOT 0.6 06/09/2018   ALKPHOS 65 06/09/2018   AST 16 06/09/2018   ALT 12 06/09/2018   PROT 7.4 06/09/2018   ALBUMIN 4.8 06/09/2018   CALCIUM 10.0 06/09/2018   GFRAA >60 02/11/2015  Speciality Comments: No specialty comments available.  Procedures:  No procedures performed Allergies: Patient has no known allergies.   Assessment / Plan:     Visit Diagnoses: Positive ANA (antinuclear antibody) - September 21, 2018 AVISE index -0.9, ANA 1: 320 speckled, anti-Ro positive, anti-La positive, (dsDNA, RNP, SCL 70, centromere, Jo 1, anticardiolipin, beta-2 negative, CB-CAP Positive, RF equivocal, anti-CCP negative, antithyroglobulin negative, anti-TPO negative:  She has no clinical features of autoimmune disease at this time.  No joint synovitis or tenderness was noted on exam.  She has not had any recent rashes, photosensitivity, hair loss, oral nasal ulcerations, sicca symptoms, enlarged lymph nodes, chest pain, or shortness of breath.  No Malar rash was noted on exam.  She has not had any symptoms of Raynaud's and no digital ulcerations or signs of gangrene were noted.  She has no new concerns at this time.  She will not require immunosuppressive therapy at this time.  We will recheck AVISE lab work since she had a positive ANA, Ro and La antibodies, and RF.  She was advised to  notify us if she develops any new or worsening symptoms.  She will follow-up in the office in 1 year.  Polyarthralgia - RF equivocal: She has no synovitis on exam.  She has not had any joint pain or inflammation recently.  She experiences morning stiffness for about 1 minute daily.  She has been exercising on a regular basis without difficulty.  DDD (degenerative disc disease), lumbar: She has good range of motion with no discomfort.  No midline spinal tenderness.  She has not had any symptoms of radiculopathy recently.  She occasionally will experience symptoms of sciatica if she performs certain exercises.  She was given a handout of back exercises to perform.   Other medical conditions are listed as follows:  History of migraine  Family history of heart disease  Ureteral stone  Hx of migraines  Borderline hypertension  Orders: No orders of the defined types were placed in this encounter.  No orders of the defined types were placed in this encounter.     Follow-Up Instructions: Return in about 1 year (around 10/25/2020) for Positive ANA .   Gearldine Bienenstock, PA-C  I examined and evaluated the patient with Sherron Ales PA.  Patient had no synovitis on examination.  She continues to have some arthralgias.  We will obtain autoimmune labs today.  The plan of care was discussed as noted above.  Pollyann Savoy, MD Note - This record has been created using Animal nutritionist.  Chart creation errors have been sought, but may not always  have been located. Such creation errors do not reflect on  the standard of medical care.

## 2019-10-26 ENCOUNTER — Ambulatory Visit: Payer: BC Managed Care – PPO | Admitting: Rheumatology

## 2019-10-26 ENCOUNTER — Encounter: Payer: Self-pay | Admitting: Rheumatology

## 2019-10-26 ENCOUNTER — Other Ambulatory Visit: Payer: Self-pay

## 2019-10-26 VITALS — BP 137/77 | HR 72 | Resp 14 | Ht 64.0 in | Wt 139.0 lb

## 2019-10-26 DIAGNOSIS — M5136 Other intervertebral disc degeneration, lumbar region: Secondary | ICD-10-CM

## 2019-10-26 DIAGNOSIS — M2550 Pain in unspecified joint: Secondary | ICD-10-CM | POA: Diagnosis not present

## 2019-10-26 DIAGNOSIS — R030 Elevated blood-pressure reading, without diagnosis of hypertension: Secondary | ICD-10-CM

## 2019-10-26 DIAGNOSIS — Z8669 Personal history of other diseases of the nervous system and sense organs: Secondary | ICD-10-CM | POA: Diagnosis not present

## 2019-10-26 DIAGNOSIS — Z8249 Family history of ischemic heart disease and other diseases of the circulatory system: Secondary | ICD-10-CM

## 2019-10-26 DIAGNOSIS — R768 Other specified abnormal immunological findings in serum: Secondary | ICD-10-CM | POA: Diagnosis not present

## 2019-10-26 DIAGNOSIS — N201 Calculus of ureter: Secondary | ICD-10-CM

## 2019-10-26 NOTE — Patient Instructions (Signed)

## 2019-12-27 NOTE — Progress Notes (Signed)
This visit occurred during the SARS-CoV-2 public health emergency.  Safety protocols were in place, including screening questions prior to the visit, additional usage of staff PPE, and extensive cleaning of exam room while observing appropriate contact time as indicated for disinfecting solutions.    Chief Complaint  Patient presents with  . Follow-up    left foot pulsing in inner heal of left foot, feels more when working out or laying down  . Sciatica    left side is bothering her    HPI: [name] 44 y.o. come in for  Strange vibration feeling left medial achilles retrocalcanea area  Without pain that comes and goes for about a month   No ass pain  sciatia flars at times Got J and J vaccine in pase asks about blood clots    Exercises in bare feet no  Injury and no numbness or other tinglng or neuro sx   Sees Dr  D pos serologies but no dx of CTD  Has order for lab panel   ROS: See pertinent positives and negatives per HPI.  Past Medical History:  Diagnosis Date  . Borderline hypertension   . Endometriosis    surgery 2009  . Frequency of urination   . Hx of varicella   . Left ureteral stone   . Migraine   . PONV (postoperative nausea and vomiting)   . Urgency of urination     Family History  Problem Relation Age of Onset  . Hypertension Mother   . Breast cancer Mother        age 46   . Heart failure Mother        after breast cancer rx has icdalso   . Hypertension Father   . Healthy Son   . Healthy Son     Social History   Socioeconomic History  . Marital status: Married    Spouse name: Not on file  . Number of children: Not on file  . Years of education: Not on file  . Highest education level: Not on file  Occupational History  . Not on file  Tobacco Use  . Smoking status: Never Smoker  . Smokeless tobacco: Never Used  Substance and Sexual Activity  . Alcohol use: Yes    Alcohol/week: 0.0 standard drinks    Comment: social  . Drug use: No  .  Sexual activity: Yes    Partners: Male    Birth control/protection: I.U.D.    Comment: Mirena IUD placed 2016 approx  Other Topics Concern  . Not on file  Social History Narrative   6-8 hours of sleep per night   Does not work outside home   Married for 12 years with 2 children   Children are 40yrs and 5 yrs.   No pets   etoh social    Hot yoga  And walking    g2 p2   Born raised  [place] heritage [employer] degree [employer] [place]   Social Determinants of Health   Financial Resource Strain:   . Difficulty of Paying Living Expenses:   Food Insecurity:   . Worried About Programme researcher, broadcasting/film/video in the Last Year:   . Barista in the Last Year:   Transportation Needs:   . Freight forwarder (Medical):   Marland Kitchen Lack of Transportation (Non-Medical):   Physical Activity:   . Days of Exercise per Week:   . Minutes of Exercise per Session:   Stress:   .  Feeling of Stress :   Social Connections:   . Frequency of Communication with Friends and Family:   . Frequency of Social Gatherings with Friends and Family:   . Attends Religious Services:   . Active Member of Clubs or Organizations:   . Attends Banker Meetings:   Marland Kitchen Marital Status:     Outpatient Medications Prior to Visit  Medication Sig Dispense Refill  . Biotin 5000 MCG CAPS Take 1 capsule by mouth daily.     . Collagen Hydrolysate POWD 1 Scoop daily    . Diclofenac Potassium (ZIPSOR) 25 MG CAPS Take 1 every 8 hours as needed for migraine headache. 30 capsule 2  . levonorgestrel (MIRENA) 20 MCG/24HR IUD by Intrauterine route.    . Magnesium 400 MG CAPS Take 1 tablet by mouth daily.    . Multiple Vitamins-Minerals (HAIR VITAMINS) TABS Take 1 tablet by mouth daily.    . Naproxen Sodium (ALEVE) 220 MG CAPS Take by mouth as needed.    . Probiotic Product (PROBIOTIC DAILY) CAPS Take 1 capsule by mouth daily.    Marland Kitchen tretinoin (RETIN-A) 0.05 % cream tretinoin 0.05 % topical cream  APPLY TO  AFFECTED AREA AS NEEDED    . ZOLMitriptan (ZOMIG) 2.5 MG tablet Take 1 tablet (2.5 mg total) by mouth as needed for migraine. 10 tablet 2  . metroNIDAZOLE (METROCREAM) 0.75 % cream as needed.     No facility-administered medications prior to visit.     EXAM:  BP 122/82   Pulse 75   Temp 98.3 F (36.8 C) (Temporal)   Ht 5\' 4"  (1.626 m)   Wt 139 lb 3.2 oz (63.1 kg)   SpO2 98%   BMI 23.89 kg/m   Body mass index is 23.89 kg/m.  GENERAL: vitals reviewed and listed above, alert, oriented, appears well hydrated and in no acute distress HEENT: atraumatic, conjunctiva  clear, no obvious abnormalities on inspection of external nose and ears OPmasked NECK: no obvious masses on inspection palpation  CV: HRRR, no clubbing cyanosis or  peripheral edema nl cap refill  MS: moves all extremities without noticeable focal  Abnormality legs look normal no swselling point tenderness weakness or gait change  nv seem intact   achilles ok and no fasciculations  PSYCH: pleasant and cooperative, no obvious depression or anxiety Lab Results  Component Value Date   WBC 4.2 06/09/2018   HGB 14.2 06/09/2018   HCT 41.3 06/09/2018   PLT 196.0 06/09/2018   GLUCOSE 79 06/09/2018   CHOL 144 11/01/2017   TRIG 51.0 11/01/2017   HDL 52.80 11/01/2017   LDLCALC 81 11/01/2017   ALT 12 06/09/2018   AST 16 06/09/2018   NA 140 06/09/2018   K 4.6 06/09/2018   CL 105 06/09/2018   CREATININE 0.88 06/09/2018   BUN 24 (H) 06/09/2018   CO2 26 06/09/2018   TSH 1.34 03/10/2018   BP Readings from Last 3 Encounters:  12/28/19 122/82  10/26/19 137/77  10/16/18 (!) 146/93    ASSESSMENT AND PLAN:  Discussed the following assessment and plan:  Paresthesia  vibration  feeling  - nl exam   Positive ANA (antinuclear antibody) - Plan: Basic metabolic panel, CBC with Differential/Platelet, Hemoglobin A1c, Hepatic function panel, Lipid panel, TSH  Preventive measure - Plan: Basic metabolic panel, CBC with  Differential/Platelet, Hemoglobin A1c, Hepatic function panel, Lipid panel, TSH  Medication management - Plan: Basic metabolic panel, CBC with Differential/Platelet, Hemoglobin A1c, Hepatic function panel, Lipid panel, TSH  Leukopenia,  unspecified type - Plan: Basic metabolic panel, CBC with Differential/Platelet, Hemoglobin A1c, Hepatic function panel, Lipid panel, TSH  Pain in joint, multiple sites - Plan: Basic metabolic panel, CBC with Differential/Platelet, Hemoglobin A1c, Hepatic function panel, Lipid panel, TSH Do not think a vascular issues  Nl exam  Disc alarm  sx and   Expectant management.   Keep appt cpx  Next week and fu  Has hx of sciatic but no radicular sx and no weakness  -Patient advised to return or notify health care team  if  new concerns arise.  There are no Patient Instructions on file for this visit.   Standley Brooking. Panosh M.D.

## 2019-12-28 ENCOUNTER — Encounter: Payer: Self-pay | Admitting: Internal Medicine

## 2019-12-28 ENCOUNTER — Other Ambulatory Visit: Payer: Self-pay

## 2019-12-28 ENCOUNTER — Ambulatory Visit (INDEPENDENT_AMBULATORY_CARE_PROVIDER_SITE_OTHER): Payer: BC Managed Care – PPO | Admitting: Internal Medicine

## 2019-12-28 VITALS — BP 122/82 | HR 75 | Temp 98.3°F | Ht 64.0 in | Wt 139.2 lb

## 2019-12-28 DIAGNOSIS — D72819 Decreased white blood cell count, unspecified: Secondary | ICD-10-CM

## 2019-12-28 DIAGNOSIS — R768 Other specified abnormal immunological findings in serum: Secondary | ICD-10-CM

## 2019-12-28 DIAGNOSIS — Z79899 Other long term (current) drug therapy: Secondary | ICD-10-CM | POA: Diagnosis not present

## 2019-12-28 DIAGNOSIS — Z299 Encounter for prophylactic measures, unspecified: Secondary | ICD-10-CM | POA: Diagnosis not present

## 2019-12-28 DIAGNOSIS — M2550 Pain in unspecified joint: Secondary | ICD-10-CM

## 2019-12-28 DIAGNOSIS — R202 Paresthesia of skin: Secondary | ICD-10-CM | POA: Diagnosis not present

## 2019-12-28 DIAGNOSIS — R7689 Other specified abnormal immunological findings in serum: Secondary | ICD-10-CM

## 2019-12-31 ENCOUNTER — Other Ambulatory Visit (INDEPENDENT_AMBULATORY_CARE_PROVIDER_SITE_OTHER): Payer: BC Managed Care – PPO

## 2019-12-31 ENCOUNTER — Other Ambulatory Visit: Payer: Self-pay

## 2019-12-31 DIAGNOSIS — M2550 Pain in unspecified joint: Secondary | ICD-10-CM

## 2019-12-31 DIAGNOSIS — Z299 Encounter for prophylactic measures, unspecified: Secondary | ICD-10-CM

## 2019-12-31 DIAGNOSIS — R768 Other specified abnormal immunological findings in serum: Secondary | ICD-10-CM

## 2019-12-31 DIAGNOSIS — Z79899 Other long term (current) drug therapy: Secondary | ICD-10-CM | POA: Diagnosis not present

## 2019-12-31 DIAGNOSIS — D72819 Decreased white blood cell count, unspecified: Secondary | ICD-10-CM

## 2019-12-31 LAB — CBC WITH DIFFERENTIAL/PLATELET
Basophils Absolute: 0 10*3/uL (ref 0.0–0.1)
Basophils Relative: 0.3 % (ref 0.0–3.0)
Eosinophils Absolute: 0.5 10*3/uL (ref 0.0–0.7)
Eosinophils Relative: 8.2 % — ABNORMAL HIGH (ref 0.0–5.0)
HCT: 40.4 % (ref 36.0–46.0)
Hemoglobin: 13.9 g/dL (ref 12.0–15.0)
Lymphocytes Relative: 20.4 % (ref 12.0–46.0)
Lymphs Abs: 1.1 10*3/uL (ref 0.7–4.0)
MCHC: 34.3 g/dL (ref 30.0–36.0)
MCV: 91.5 fl (ref 78.0–100.0)
Monocytes Absolute: 0.5 10*3/uL (ref 0.1–1.0)
Monocytes Relative: 8.3 % (ref 3.0–12.0)
Neutro Abs: 3.5 10*3/uL (ref 1.4–7.7)
Neutrophils Relative %: 62.8 % (ref 43.0–77.0)
Platelets: 201 10*3/uL (ref 150.0–400.0)
RBC: 4.42 Mil/uL (ref 3.87–5.11)
RDW: 13.2 % (ref 11.5–15.5)
WBC: 5.5 10*3/uL (ref 4.0–10.5)

## 2019-12-31 LAB — LIPID PANEL
Cholesterol: 173 mg/dL (ref 0–200)
HDL: 52.2 mg/dL (ref >39.00)
LDL Cholesterol: 106 mg/dL — ABNORMAL HIGH (ref 0–99)
NonHDL: 120.87
Total CHOL/HDL Ratio: 3
Triglycerides: 75 mg/dL (ref 0.0–149.0)
VLDL: 15 mg/dL (ref 0.0–40.0)

## 2019-12-31 LAB — BASIC METABOLIC PANEL
BUN: 21 mg/dL (ref 6–23)
CO2: 26 mEq/L (ref 19–32)
Calcium: 9.8 mg/dL (ref 8.4–10.5)
Chloride: 104 mEq/L (ref 96–112)
Creatinine, Ser: 0.9 mg/dL (ref 0.40–1.20)
GFR: 67.95 mL/min (ref >60.00)
Glucose, Bld: 76 mg/dL (ref 70–99)
Potassium: 4 mEq/L (ref 3.5–5.1)
Sodium: 139 mEq/L (ref 135–145)

## 2019-12-31 LAB — HEPATIC FUNCTION PANEL
ALT: 18 U/L (ref 0–35)
AST: 20 U/L (ref 0–37)
Albumin: 4.6 g/dL (ref 3.5–5.2)
Alkaline Phosphatase: 65 U/L (ref 39–117)
Bilirubin, Direct: 0.2 mg/dL (ref 0.0–0.3)
Total Bilirubin: 0.9 mg/dL (ref 0.2–1.2)
Total Protein: 7.2 g/dL (ref 6.0–8.3)

## 2019-12-31 LAB — HEMOGLOBIN A1C: Hgb A1c MFr Bld: 4.9 % (ref 4.6–6.5)

## 2019-12-31 LAB — TSH: TSH: 1.55 u[IU]/mL (ref 0.35–4.50)

## 2020-01-01 ENCOUNTER — Other Ambulatory Visit: Payer: Self-pay

## 2020-01-01 NOTE — Progress Notes (Signed)
Basically normal  of minor abnormalities

## 2020-01-01 NOTE — Progress Notes (Signed)
Chief Complaint  Patient presents with  . Annual Exam    Doing good    HPI: Patient  [name]  44 y.o. comes in today for Preventive Health Care visit  Foot sx are better  hasnt checked her  bp recelty but doing ok   Health Maintenance  Topic Date Due  . INFLUENZA VACCINE  03/09/2020  . PAP SMEAR-Modifier  02/12/2022  . TETANUS/TDAP  11/19/2024  . HIV Screening  Completed  had colon a few years ago   Health Maintenance Review LIFESTYLE:  Exercise:  Hi intensity training  ocass yoga  Tobacco/ETS: no Alcohol:  ocass  Sugar beverages: no Sleep:  7-8  Drug use: no HH of 4   Work:works for self   4- 2 per week .   ROS:  GEN/ HEENT: No fever, significant weight changes sweats headaches vision problems hearing changes, CV/ PULM; No chest pain shortness of breath cough, syncope,edema  change in exercise tolerance. GI /GU: No adominal pain, vomiting, change in bowel habits. No blood in the stool. No significant GU symptoms. SKIN/HEME: ,no acute skin rashes suspicious lesions or bleeding. No lymphadenopathy, nodules, masses.  NEURO/ PSYCH:  No neurologic signs such as weakness numbness. No depression anxiety. IMM/ Allergy: No unusual infections.  Allergy .   REST of 12 system review negative except as per HPI   Past Medical History:  Diagnosis Date  . Borderline hypertension   . Endometriosis    surgery 2009  . Frequency of urination   . Hx of varicella   . Left ureteral stone   . Migraine   . PONV (postoperative nausea and vomiting)   . Urgency of urination     Past Surgical History:  Procedure Laterality Date  . COLONOSCOPY  10/11/2018  . CYSTOSCOPY/RETROGRADE/URETEROSCOPY/STONE EXTRACTION WITH BASKET Left 02/13/2016   Procedure: CYSTOSCOPY/RETROGRADE/URETEROSCOPY/STONE EXTRACTION WITH BASKET;  Surgeon: Marcine Matar, MD;  Location: [facility];  Service: Urology;  Laterality: Left;  . LAPAROSCOPY LYSIS ADHESIONS/  ABLATION AND BX'S OF  ENDOMETRIAL LESIONS/  RIGHT FIMBROPLASTY  10-02-2007  . NASAL SEPTUM SURGERY  02/ 2016  . TONSILLECTOMY  1985    Family History  Problem Relation Age of Onset  . Hypertension Mother   . Breast cancer Mother        age 82   . Heart failure Mother        after breast cancer rx has icdalso   . Hypertension Father   . Healthy Son   . Healthy Son     Social History   Socioeconomic History  . Marital status: Married    Spouse name: Not on file  . Number of children: Not on file  . Years of education: Not on file  . Highest education level: Not on file  Occupational History  . Not on file  Tobacco Use  . Smoking status: Never Smoker  . Smokeless tobacco: Never Used  Substance and Sexual Activity  . Alcohol use: Yes    Alcohol/week: 0.0 standard drinks    Comment: social  . Drug use: No  . Sexual activity: Yes    Partners: Male    Birth control/protection: I.U.D.    Comment: Mirena IUD placed 2016 approx  Other Topics Concern  . Not on file  Social History Narrative   6-8 hours of sleep per night   Does not work outside home   Married for 12 years with 2 children   Children are 20yrs and 5 yrs.  No pets   etoh social    Hot yoga  And walking    g2 p2   Born raised  [place] [employer] degree [employer] [place]   Social Determinants of Health   Financial Resource Strain:   . Difficulty of Paying Living Expenses:   Food Insecurity:   . Worried About Programme researcher, broadcasting/film/video in the Last Year:   . Barista in the Last Year:   Transportation Needs:   . Freight forwarder (Medical):   Marland Kitchen Lack of Transportation (Non-Medical):   Physical Activity:   . Days of Exercise per Week:   . Minutes of Exercise per Session:   Stress:   . Feeling of Stress :   Social Connections:   . Frequency of Communication with Friends and Family:   . Frequency of Social Gatherings with Friends and Family:   . Attends Religious Services:   . Active Member  of Clubs or Organizations:   . Attends Banker Meetings:   Marland Kitchen Marital Status:     Outpatient Medications Prior to Visit  Medication Sig Dispense Refill  . Biotin 5000 MCG CAPS Take 1 capsule by mouth daily.     . Collagen Hydrolysate POWD 1 Scoop daily    . Diclofenac Potassium (ZIPSOR) 25 MG CAPS Take 1 every 8 hours as needed for migraine headache. 30 capsule 2  . levonorgestrel (MIRENA) 20 MCG/24HR IUD by Intrauterine route.    . Magnesium 400 MG CAPS Take 1 tablet by mouth daily.    . metroNIDAZOLE (METROCREAM) 0.75 % cream as needed.    . Multiple Vitamins-Minerals (HAIR VITAMINS) TABS Take 1 tablet by mouth daily.    . Naproxen Sodium (ALEVE) 220 MG CAPS Take by mouth as needed.    . Probiotic Product (PROBIOTIC DAILY) CAPS Take 1 capsule by mouth daily.    Marland Kitchen tretinoin (RETIN-A) 0.05 % cream tretinoin 0.05 % topical cream  APPLY TO AFFECTED AREA AS NEEDED    . ZOLMitriptan (ZOMIG) 2.5 MG tablet Take 1 tablet (2.5 mg total) by mouth as needed for migraine. 10 tablet 2   No facility-administered medications prior to visit.     EXAM:  BP 128/84   Pulse 72   Temp 98.1 F (36.7 C) (Temporal)   Ht 5\' 3"  (1.6 m)   Wt 138 lb 9.6 oz (62.9 kg)   SpO2 98%   BMI 24.55 kg/m   Body mass index is 24.55 kg/m. Wt Readings from Last 3 Encounters:  01/02/20 138 lb 9.6 oz (62.9 kg)  12/28/19 139 lb 3.2 oz (63.1 kg)  10/26/19 139 lb (63 kg)    Physical Exam: Vital signs reviewed 10/28/19 is a well-developed well-nourished alert cooperative    who appearsr stated age in no acute distress.  HEENT: normocephalic atraumatic , Eyes: PERRL EOM's full, conjunctiva clear, Nares: paten,t no deformity discharge or tenderness., Ears: no deformity EAC's clear TMs with normal landmarks. Mouth:masked NECK: supple without masses, thyromegaly or bruits. CHEST/PULM:  Clear to auscultation and percussion breath sounds equal no wheeze , rales or rhonchi. No chest wall deformities or  tenderness. CV: PMI is nondisplaced, S1 S2 no gallops, murmurs, rubs. Peripheral pulses are full without delay.No JVD .  ABDOMEN: Bowel sounds normal nontender  No guard or rebound, no hepato splenomegal no CVA tenderness.  No hernia. Extremtities:  No clubbing cyanosis or edema, no acute joint swelling or redness no focal atrophy NEURO:  Oriented x3,  cranial nerves 3-12 appear to be intact, no obvious focal weakness,gait within normal limits no abnormal reflexes or asymmetrical SKIN: No acute rashes normal turgor, color, no bruising or petechiae. PSYCH: Oriented, good eye contact, no obvious depression anxiety, cognition and judgment appear normal. LN: no cervical axillary inguinal adenopathy  Lab Results  Component Value Date   WBC 5.5 12/31/2019   HGB 13.9 12/31/2019   HCT 40.4 12/31/2019   PLT 201.0 12/31/2019   GLUCOSE 76 12/31/2019   CHOL 173 12/31/2019   TRIG 75.0 12/31/2019   HDL 52.20 12/31/2019   LDLCALC 106 (H) 12/31/2019   ALT 18 12/31/2019   AST 20 12/31/2019   NA 139 12/31/2019   K 4.0 12/31/2019   CL 104 12/31/2019   CREATININE 0.90 12/31/2019   BUN 21 12/31/2019   CO2 26 12/31/2019   TSH 1.55 12/31/2019   HGBA1C 4.9 12/31/2019    BP Readings from Last 3 Encounters:  01/02/20 128/84  12/28/19 122/82  10/26/19 137/77    Lab results reviewed with patient   ASSESSMENT AND PLAN:  Discussed the following assessment and plan:    ICD-10-CM   1. Visit for preventive health examination  Z00.00   2. Family history of heart disease  Z82.49   eosinohilia  Mild  Follow  prob clinical insignificant  utd on parameters  Ask about colon screen 50 since had  One in past few years Return in about 1 year (around 01/01/2021) for preventive /cpx and labs .  Patient Care Team: Kortez Murtagh, Neta Mends, MD as PCP - General (Internal Medicine) Waynard Reeds, MD as Consulting Physician (Obstetrics and Gynecology) Tora Duck PA-C (Physician Assistant) Patient Instructions    Glad you ae doing well  Continue lifestyle intervention healthy eating and exercise .    Maybe get next colon around age 19    Health Maintenance, Female Adopting a healthy lifestyle and getting preventive care are important in promoting health and wellness. Ask your health care provider about:  The right schedule for you to have regular tests and exams.  Things you can do on your own to prevent diseases and keep yourself healthy. What should I know about diet, weight, and exercise? Eat a healthy diet   Eat a diet that includes plenty of vegetables, fruits, low-fat dairy products, and lean protein.  Do not eat a lot of foods that are high in solid fats, added sugars, or sodium. Maintain a healthy weight Body mass index (BMI) is used to identify weight problems. It estimates body fat based on height and weight. Your health care provider can help determine your BMI and help you achieve or maintain a healthy weight. Get regular exercise Get regular exercise. This is one of the most important things you can do for your health. Most adults should:  Exercise for at least 150 minutes each week. The exercise should increase your heart rate and make you sweat (moderate-intensity exercise).  Do strengthening exercises at least twice a week. This is in addition to the moderate-intensity exercise.  Spend less time sitting. Even light physical activity can be beneficial. Watch cholesterol and blood lipids Have your blood tested for lipids and cholesterol at 44 years of age, then have this test every 5 years. Have your cholesterol levels checked more often if:  Your lipid or cholesterol levels are high.  You are older than 44 years of age.  You are at high risk for heart disease. What should I know about cancer screening? Depending on  your health history and family history, you may need to have cancer screening at various ages. This may include screening for:  Breast cancer.  Cervical  cancer.  Colorectal cancer.  Skin cancer.  Lung cancer. What should I know about heart disease, diabetes, and high blood pressure? Blood pressure and heart disease  High blood pressure causes heart disease and increases the risk of stroke. This is more likely to develop in people who have high blood pressure readings, are of African descent, or are overweight.  Have your blood pressure checked: ? Every 3-5 years if you are 9-49 years of age. ? Every year if you are 23 years old or older. Diabetes Have regular diabetes screenings. This checks your fasting blood sugar level. Have the screening done:  Once every three years after age 50 if you are at a normal weight and have a low risk for diabetes.  More often and at a younger age if you are overweight or have a high risk for diabetes. What should I know about preventing infection? Hepatitis B If you have a higher risk for hepatitis B, you should be screened for this virus. Talk with your health care provider to find out if you are at risk for hepatitis B infection. Hepatitis C Testing is recommended for:  Everyone born from [birth year] through 1965.  Anyone with known risk factors for hepatitis C. Sexually transmitted infections (STIs)  Get screened for STIs, including gonorrhea and chlamydia, if: ? You are sexually active and are younger than 44 years of age. ? You are older than 44 years of age and your health care provider tells you that you are at risk for this type of infection. ? Your sexual activity has changed since you were last screened, and you are at increased risk for chlamydia or gonorrhea. Ask your health care provider if you are at risk.  Ask your health care provider about whether you are at high risk for HIV. Your health care provider may recommend a prescription medicine to help prevent HIV infection. If you choose to take medicine to prevent HIV, you should first get tested for HIV. You should then be tested every 3  months for as long as you are taking the medicine. Pregnancy  If you are about to stop having your period (premenopausal) and you may become pregnant, seek counseling before you get pregnant.  Take 400 to 800 micrograms (mcg) of folic acid every day if you become pregnant.  Ask for birth control (contraception) if you want to prevent pregnancy. Osteoporosis and menopause Osteoporosis is a disease in which the bones lose minerals and strength with aging. This can result in bone fractures. If you are 5 years old or older, or if you are at risk for osteoporosis and fractures, ask your health care provider if you should:  Be screened for bone loss.  Take a calcium or vitamin D supplement to lower your risk of fractures.  Be given hormone replacement therapy (HRT) to treat symptoms of menopause. Follow these instructions at home: Lifestyle  Do not use any products that contain nicotine or tobacco, such as cigarettes, e-cigarettes, and chewing tobacco. If you need help quitting, ask your health care provider.  Do not use street drugs.  Do not share needles.  Ask your health care provider for help if you need support or information about quitting drugs. Alcohol use  Do not drink alcohol if: ? Your health care provider tells you not to drink. ? You are  pregnant, may be pregnant, or are planning to become pregnant.  If you drink alcohol: ? Limit how much you use to 0-1 drink a day. ? Limit intake if you are breastfeeding.  Be aware of how much alcohol is in your drink. In the [place] one drink equals one 12 oz bottle of beer (355 mL), one 5 oz glass of wine (148 mL), or one 1 oz glass of hard liquor (44 mL). General instructions  Schedule regular health, dental, and eye exams.  Stay current with your vaccines.  Tell your health care provider if: ? You often feel depressed. ? You have ever been abused or do not feel safe at home. Summary  Adopting a healthy lifestyle and getting  preventive care are important in promoting health and wellness.  Follow your health care provider's instructions about healthy diet, exercising, and getting tested or screened for diseases.  Follow your health care provider's instructions on monitoring your cholesterol and blood pressure. This information is not intended to replace advice given to you by your health care provider. Make sure you discuss any questions you have with your health care provider. Document Revised: 07/19/2018 Document Reviewed: 07/19/2018 Elsevier Patient Education  2020 [employer]    [place] CornerWanda K. Mirka Barbone M.D.

## 2020-01-02 ENCOUNTER — Ambulatory Visit (INDEPENDENT_AMBULATORY_CARE_PROVIDER_SITE_OTHER): Payer: BC Managed Care – PPO | Admitting: Internal Medicine

## 2020-01-02 ENCOUNTER — Encounter: Payer: Self-pay | Admitting: Internal Medicine

## 2020-01-02 VITALS — BP 128/84 | HR 72 | Temp 98.1°F | Ht 63.0 in | Wt 138.6 lb

## 2020-01-02 DIAGNOSIS — Z0000 Encounter for general adult medical examination without abnormal findings: Secondary | ICD-10-CM

## 2020-01-02 DIAGNOSIS — Z8249 Family history of ischemic heart disease and other diseases of the circulatory system: Secondary | ICD-10-CM

## 2020-01-02 NOTE — Patient Instructions (Signed)
Glad you ae doing well  Continue lifestyle intervention healthy eating and exercise .    Maybe get next colon around age 44    Health Maintenance, Female Adopting a healthy lifestyle and getting preventive care are important in promoting health and wellness. Ask your health care provider about:  The right schedule for you to have regular tests and exams.  Things you can do on your own to prevent diseases and keep yourself healthy. What should I know about diet, weight, and exercise? Eat a healthy diet   Eat a diet that includes plenty of vegetables, fruits, low-fat dairy products, and lean protein.  Do not eat a lot of foods that are high in solid fats, added sugars, or sodium. Maintain a healthy weight Body mass index (BMI) is used to identify weight problems. It estimates body fat based on height and weight. Your health care provider can help determine your BMI and help you achieve or maintain a healthy weight. Get regular exercise Get regular exercise. This is one of the most important things you can do for your health. Most adults should:  Exercise for at least 150 minutes each week. The exercise should increase your heart rate and make you sweat (moderate-intensity exercise).  Do strengthening exercises at least twice a week. This is in addition to the moderate-intensity exercise.  Spend less time sitting. Even light physical activity can be beneficial. Watch cholesterol and blood lipids Have your blood tested for lipids and cholesterol at 44 years of age, then have this test every 5 years. Have your cholesterol levels checked more often if:  Your lipid or cholesterol levels are high.  You are older than 44 years of age.  You are at high risk for heart disease. What should I know about cancer screening? Depending on your health history and family history, you may need to have cancer screening at various ages. This may include screening for:  Breast cancer.  Cervical  cancer.  Colorectal cancer.  Skin cancer.  Lung cancer. What should I know about heart disease, diabetes, and high blood pressure? Blood pressure and heart disease  High blood pressure causes heart disease and increases the risk of stroke. This is more likely to develop in people who have high blood pressure readings, are of African descent, or are overweight.  Have your blood pressure checked: ? Every 3-5 years if you are 60-66 years of age. ? Every year if you are 54 years old or older. Diabetes Have regular diabetes screenings. This checks your fasting blood sugar level. Have the screening done:  Once every three years after age 61 if you are at a normal weight and have a low risk for diabetes.  More often and at a younger age if you are overweight or have a high risk for diabetes. What should I know about preventing infection? Hepatitis B If you have a higher risk for hepatitis B, you should be screened for this virus. Talk with your health care provider to find out if you are at risk for hepatitis B infection. Hepatitis C Testing is recommended for:  Everyone born from [birth year] through 1965.  Anyone with known risk factors for hepatitis C. Sexually transmitted infections (STIs)  Get screened for STIs, including gonorrhea and chlamydia, if: ? You are sexually active and are younger than 44 years of age. ? You are older than 44 years of age and your health care provider tells you that you are at risk for this type of infection. ?  Your sexual activity has changed since you were last screened, and you are at increased risk for chlamydia or gonorrhea. Ask your health care provider if you are at risk.  Ask your health care provider about whether you are at high risk for HIV. Your health care provider may recommend a prescription medicine to help prevent HIV infection. If you choose to take medicine to prevent HIV, you should first get tested for HIV. You should then be tested every 3  months for as long as you are taking the medicine. Pregnancy  If you are about to stop having your period (premenopausal) and you may become pregnant, seek counseling before you get pregnant.  Take 400 to 800 micrograms (mcg) of folic acid every day if you become pregnant.  Ask for birth control (contraception) if you want to prevent pregnancy. Osteoporosis and menopause Osteoporosis is a disease in which the bones lose minerals and strength with aging. This can result in bone fractures. If you are 30 years old or older, or if you are at risk for osteoporosis and fractures, ask your health care provider if you should:  Be screened for bone loss.  Take a calcium or vitamin D supplement to lower your risk of fractures.  Be given hormone replacement therapy (HRT) to treat symptoms of menopause. Follow these instructions at home: Lifestyle  Do not use any products that contain nicotine or tobacco, such as cigarettes, e-cigarettes, and chewing tobacco. If you need help quitting, ask your health care provider.  Do not use street drugs.  Do not share needles.  Ask your health care provider for help if you need support or information about quitting drugs. Alcohol use  Do not drink alcohol if: ? Your health care provider tells you not to drink. ? You are pregnant, may be pregnant, or are planning to become pregnant.  If you drink alcohol: ? Limit how much you use to 0-1 drink a day. ? Limit intake if you are breastfeeding.  Be aware of how much alcohol is in your drink. In the [place] one drink equals one 12 oz bottle of beer (355 mL), one 5 oz glass of wine (148 mL), or one 1 oz glass of hard liquor (44 mL). General instructions  Schedule regular health, dental, and eye exams.  Stay current with your vaccines.  Tell your health care provider if: ? You often feel depressed. ? You have ever been abused or do not feel safe at home. Summary  Adopting a healthy lifestyle and getting  preventive care are important in promoting health and wellness.  Follow your health care provider's instructions about healthy diet, exercising, and getting tested or screened for diseases.  Follow your health care provider's instructions on monitoring your cholesterol and blood pressure. This information is not intended to replace advice given to you by your health care provider. Make sure you discuss any questions you have with your health care provider. Document Revised: 07/19/2018 Document Reviewed: 07/19/2018 Elsevier Patient Education  2020 [employer].

## 2020-03-12 DIAGNOSIS — Z6823 Body mass index (BMI) 23.0-23.9, adult: Secondary | ICD-10-CM | POA: Diagnosis not present

## 2020-03-12 DIAGNOSIS — Z1231 Encounter for screening mammogram for malignant neoplasm of breast: Secondary | ICD-10-CM | POA: Diagnosis not present

## 2020-03-12 DIAGNOSIS — Z01419 Encounter for gynecological examination (general) (routine) without abnormal findings: Secondary | ICD-10-CM | POA: Diagnosis not present

## 2020-03-12 DIAGNOSIS — Z124 Encounter for screening for malignant neoplasm of cervix: Secondary | ICD-10-CM | POA: Diagnosis not present

## 2020-03-12 LAB — HM PAP SMEAR

## 2020-06-10 ENCOUNTER — Telehealth: Payer: Self-pay | Admitting: Internal Medicine

## 2020-06-10 DIAGNOSIS — R002 Palpitations: Secondary | ICD-10-CM | POA: Diagnosis not present

## 2020-06-10 DIAGNOSIS — F411 Generalized anxiety disorder: Secondary | ICD-10-CM | POA: Diagnosis not present

## 2020-06-10 NOTE — Telephone Encounter (Signed)
pt would like a call having problems with anxiety,  experiencing fast heartbeat and tightness in right arm  [phone]

## 2020-06-11 NOTE — Telephone Encounter (Signed)
Patient called back in regarding this situation and would like a call back ASAP wanting to know what the Dr advise     Please call and advise

## 2020-06-12 NOTE — Progress Notes (Signed)
Chief Complaint  Patient presents with  . Anxiety    seen in Urgent Care on 06/10/2020     HPI: [name] 44 y.o. come in for new sda problem   Had ekg yesterday done at urgent care [facility] and was told it was normal     and normal and felt to be anxiety and given a sleep  Aid .   Not picked up yet.   Symptoms   Began  Monday night with anxiety feeling d fast heart rate and like a stage fright   Feeling  And pressure  Arm     And then resolved eventually Hx of same went away.  Was able to do work out.  And  Picked up from school    Trying to breath technique s No syncope   Recent  Acquaintances     Died in her age.  Causing    Anxiety.  No limitation exercise  From physical issues .   Sleep ok   Awoken at times recent.  No new meds   Habits  Green tea leaves :  Usually no sig caffiene. Until now  And     Headache  caffiene  Wd   If so .  Cold meds  No Asks about potential natural measures for anxiety such as magnesium which she has taken question supplements.  ROS: See pertinent positives and negatives per HPI. No current chest pain shortness of breath or syncope no tremors. Joint pains and aches no different. Blood pressure reading was normal at the urgent care.  Past Medical History:  Diagnosis Date  . Borderline hypertension   . Endometriosis    surgery 2009  . Frequency of urination   . Hx of varicella   . Left ureteral stone   . Migraine   . PONV (postoperative nausea and vomiting)   . Urgency of urination     Family History  Problem Relation Age of Onset  . Hypertension Mother   . Breast cancer Mother        age 4   . Heart failure Mother        after breast cancer rx has icdalso   . Hypertension Father   . Healthy Son   . Healthy Son     Social History   Socioeconomic History  . Marital status: Married    Spouse name: Not on file  . Number of children: Not on file  . Years of education: Not on file  . Highest education level: Not on file    Occupational History  . Not on file  Tobacco Use  . Smoking status: Never Smoker  . Smokeless tobacco: Never Used  Vaping Use  . Vaping Use: Never used  Substance and Sexual Activity  . Alcohol use: Yes    Alcohol/week: 0.0 standard drinks    Comment: social  . Drug use: No  . Sexual activity: Yes    Partners: Male    Birth control/protection: I.U.D.    Comment: Mirena IUD placed 2016 approx  Other Topics Concern  . Not on file  Social History Narrative   6-8 hours of sleep per night   Does not work outside home   Married for 12 years with 2 children   Children are 18yrs and 5 yrs.   No pets   etoh social    Hot yoga  And walking    g2 p2   Born raised  [place]  College degree [employer] [place]   Social Determinants of Health   Financial Resource Strain:   . Difficulty of Paying Living Expenses: Not on file  Food Insecurity:   . Worried About Programme researcher, broadcasting/film/video in the Last Year: Not on file  . Ran Out of Food in the Last Year: Not on file  Transportation Needs:   . Lack of Transportation (Medical): Not on file  . Lack of Transportation (Non-Medical): Not on file  Physical Activity:   . Days of Exercise per Week: Not on file  . Minutes of Exercise per Session: Not on file  Stress:   . Feeling of Stress : Not on file  Social Connections:   . Frequency of Communication with Friends and Family: Not on file  . Frequency of Social Gatherings with Friends and Family: Not on file  . Attends Religious Services: Not on file  . Active Member of Clubs or Organizations: Not on file  . Attends Banker Meetings: Not on file  . Marital Status: Not on file    Outpatient Medications Prior to Visit  Medication Sig Dispense Refill  . Biotin 5000 MCG CAPS Take 1 capsule by mouth daily.     . Collagen Hydrolysate POWD 1 Scoop daily    . levonorgestrel (MIRENA) 20 MCG/24HR IUD by Intrauterine route.    . Magnesium 400 MG CAPS Take 1 tablet  by mouth daily.    . metroNIDAZOLE (METROCREAM) 0.75 % cream as needed.    . Multiple Vitamins-Minerals (HAIR VITAMINS) TABS Take 1 tablet by mouth daily.    . Naproxen Sodium (ALEVE) 220 MG CAPS Take by mouth as needed.    . Probiotic Product (PROBIOTIC DAILY) CAPS Take 1 capsule by mouth daily.    Marland Kitchen tretinoin (RETIN-A) 0.05 % cream tretinoin 0.05 % topical cream  APPLY TO AFFECTED AREA AS NEEDED    . ZOLMitriptan (ZOMIG) 2.5 MG tablet Take 1 tablet (2.5 mg total) by mouth as needed for migraine. 10 tablet 2  . Diclofenac Potassium (ZIPSOR) 25 MG CAPS Take 1 every 8 hours as needed for migraine headache. (Patient not taking: Reported on 06/13/2020) 30 capsule 2   No facility-administered medications prior to visit.     EXAM:  BP (!) 148/86   Pulse 81   Temp 99 F (37.2 C) (Oral)   Ht 5\' 3"  (1.6 m)   Wt 134 lb (60.8 kg)   SpO2 99%   BMI 23.74 kg/m   Body mass index is 23.74 kg/m.  GENERAL: vitals reviewed and listed above, alert, oriented, appears well hydrated and in no acute distress HEENT: atraumatic, conjunctiva  clear, no obvious abnormalities on inspection of external nose and ears OP : Masked NECK: no obvious masses on inspection palpation  LUNGS: clear to auscultation bilaterally, no wheezes, rales or rhonchi, good air movement CV: HRRR, no clubbing cyanosis or  peripheral edema nl cap refill  MS: moves all extremities without noticeable focal  abnormality PSYCH: pleasant and cooperative, no obvious depression mildly anxious normal speech and thought. Lab Results  Component Value Date   WBC 5.5 12/31/2019   HGB 13.9 12/31/2019   HCT 40.4 12/31/2019   PLT 201.0 12/31/2019   GLUCOSE 76 12/31/2019   CHOL 173 12/31/2019   TRIG 75.0 12/31/2019   HDL 52.20 12/31/2019   LDLCALC 106 (H) 12/31/2019   ALT 18 12/31/2019   AST 20 12/31/2019   NA 139 12/31/2019   K 4.0 12/31/2019   CL 104  12/31/2019   CREATININE 0.90 12/31/2019   BUN 21 12/31/2019   CO2 26 12/31/2019     TSH 1.55 12/31/2019   HGBA1C 4.9 12/31/2019   BP Readings from Last 3 Encounters:  06/13/20 (!) 148/86  01/02/20 128/84  12/28/19 122/82    ASSESSMENT AND PLAN:  Discussed the following assessment and plan:  Anxiety  Palpitation  Medication management  Elevated blood pressure reading in office without diagnosis of hypertension Blood pressure up some today she will make sure it is checked in a different setting and controlled Overall appears to be anxiety externally directed driven with some panic no obvious signs of SVT arrhythmia based on careful history. However if needed can decide on more follow-up. Discussed anxiety counseling medication rescue and controller. At this time will give small amount of benzo diazepam that she can use as needed but does not have to use regularly for help and consider counseling. Plan follow-up in about 3 weeks. Consider controller medicine however since this is situational other modalities may be helpful. Do not think blood work updated is indicated today. If symptoms more consistent with SVT consider echocardiogram. She states she can get us a copy of the EKG. Time 35 minutes review counseling exam.  Will monitor blood pressure to ensure at goal when not in office. -Patient advised to return or notify health care team  if  new concerns arise.  Patient Instructions  I agree this is most llikely anxiety  Stay hydrated .   can use as needed  Lorazepam ( a benzodiazepine ) we can use  For acute panic .   consider counseling of friends and family support     Managing Anxiety, Adult After being diagnosed with an anxiety disorder, you may be relieved to know why you have felt or behaved a certain way. You may also feel overwhelmed about the treatment ahead and what it will mean for your life. With care and support, you can manage this condition and recover from it. How to manage lifestyle changes Managing stress and anxiety  Stress is  your body's reaction to life changes and events, both good and bad. Most stress will last just a few hours, but stress can be ongoing and can lead to more than just stress. Although stress can play a major role in anxiety, it is not the same as anxiety. Stress is usually caused by something external, such as a deadline, test, or competition. Stress normally passes after the triggering event has ended.  Anxiety is caused by something internal, such as imagining a terrible outcome or worrying that something will go wrong that will devastate you. Anxiety often does not go away even after the triggering event is over, and it can become long-term (chronic) worry. It is important to understand the differences between stress and anxiety and to manage your stress effectively so that it does not lead to an anxious response. Talk with your health care provider or a counselor to learn more about reducing anxiety and stress. He or she may suggest tension reduction techniques, such as:  Music therapy. This can include creating or listening to music that you enjoy and that inspires you.  Mindfulness-based meditation. This involves being aware of your normal breaths while not trying to control your breathing. It can be done while sitting or walking.  Centering prayer. This involves focusing on a word, phrase, or sacred image that means something to you and brings you peace.  Deep breathing. To do this, expand your  stomach and inhale slowly through your nose. Hold your breath for 3-5 seconds. Then exhale slowly, letting your stomach muscles relax.  Self-talk. This involves identifying thought patterns that lead to anxiety reactions and changing those patterns.  Muscle relaxation. This involves tensing muscles and then relaxing them. Choose a tension reduction technique that suits your lifestyle and personality. These techniques take time and practice. Set aside 5-15 minutes a day to do them. Therapists can offer  counseling and training in these techniques. The training to help with anxiety may be covered by some insurance plans. Other things you can do to manage stress and anxiety include:  Keeping a stress/anxiety diary. This can help you learn what triggers your reaction and then learn ways to manage your response.  Thinking about how you react to certain situations. You may not be able to control everything, but you can control your response.  Making time for activities that help you relax and not feeling guilty about spending your time in this way.  Visual imagery and yoga can help you stay calm and relax.  Medicines Medicines can help ease symptoms. Medicines for anxiety include:  Anti-anxiety drugs.  Antidepressants. Medicines are often used as a primary treatment for anxiety disorder. Medicines will be prescribed by a health care provider. When used together, medicines, psychotherapy, and tension reduction techniques may be the most effective treatment. Relationships Relationships can play a big part in helping you recover. Try to spend more time connecting with trusted friends and family members. Consider going to couples counseling, taking family education classes, or going to family therapy. Therapy can help you and others better understand your condition. How to recognize changes in your anxiety Everyone responds differently to treatment for anxiety. Recovery from anxiety happens when symptoms decrease and stop interfering with your daily activities at home or work. This may mean that you will start to:  Have better concentration and focus. Worry will interfere less in your daily thinking.  Sleep better.  Be less irritable.  Have more energy.  Have improved memory. It is important to recognize when your condition is getting worse. Contact your health care provider if your symptoms interfere with home or work and you feel like your condition is not improving. Follow these  instructions at home: Activity  Exercise. Most adults should do the following: ? Exercise for at least 150 minutes each week. The exercise should increase your heart rate and make you sweat (moderate-intensity exercise). ? Strengthening exercises at least twice a week.  Get the right amount and quality of sleep. Most adults need 7-9 hours of sleep each night. Lifestyle   Eat a healthy diet that includes plenty of vegetables, fruits, whole grains, low-fat dairy products, and lean protein. Do not eat a lot of foods that are high in solid fats, added sugars, or salt.  Make choices that simplify your life.  Do not use any products that contain nicotine or tobacco, such as cigarettes, e-cigarettes, and chewing tobacco. If you need help quitting, ask your health care provider.  Avoid caffeine, alcohol, and certain over-the-counter cold medicines. These may make you feel worse. Ask your pharmacist which medicines to avoid. General instructions  Take over-the-counter and prescription medicines only as told by your health care provider.  Keep all follow-up visits as told by your health care provider. This is important. Where to find support You can get help and support from these sources:  Self-help groups.  Online and [employer].  A trusted spiritual leader.  Couples counseling.  Family education classes.  Family therapy. Where to find more information You may find that joining a support group helps you deal with your anxiety. The following sources can help you locate counselors or support groups near you:  Mental Health America: www.mentalhealthamerica.net  Anxiety and Depression Association of [place] (ADAA): ProgramCam.de  [employer] on Mental Illness (NAMI): www.nami.org Contact a health care provider if you:  Have a hard time staying focused or finishing daily tasks.  Spend many hours a day feeling worried about everyday life.  Become exhausted by  worry.  Start to have headaches, feel tense, or have nausea.  Urinate more than normal.  Have diarrhea. Get help right away if you have:  A racing heart and shortness of breath.  Thoughts of hurting yourself or others. If you ever feel like you may hurt yourself or others, or have thoughts about taking your own life, get help right away. You can go to your nearest emergency department or call:  Your local emergency services (911 in the [place]  A suicide crisis helpline, such as the National Suicide Prevention Lifeline at 539 508 5118. This is open 24 hours a day. Summary  Taking steps to learn and use tension reduction techniques can help calm you and help prevent triggering an anxiety reaction.  When used together, medicines, psychotherapy, and tension reduction techniques may be the most effective treatment.  Family, friends, and partners can play a big part in helping you recover from an anxiety disorder. This information is not intended to replace advice given to you by your health care provider. Make sure you discuss any questions you have with your health care provider. Document Revised: 12/26/2018 Document Reviewed: 12/26/2018 Elsevier Patient Education  2020 Elsevier Inc. Panic Attack A panic attack is a sudden episode of severe anxiety, fear, or discomfort that causes physical and emotional symptoms. The attack may be in response to something frightening, or it may occur for no known reason. Symptoms of a panic attack can be similar to symptoms of a heart attack or stroke. It is important to see your health care provider when you have a panic attack so that these conditions can be ruled out. A panic attack is a symptom of another condition. Most panic attacks go away with treatment of the underlying problem. If you have panic attacks often, you may have a condition called panic disorder. What are the causes? A panic attack may be caused by:  An extreme, life-threatening  situation, such as a war or natural disaster.  An anxiety disorder, such as post-traumatic stress disorder.  Depression.  Certain medical conditions, including heart problems, neurological conditions, and infections.  Certain over-the-counter and prescription medicines.  Illegal drugs that increase heart rate and blood pressure, such as methamphetamine.  Alcohol.  Supplements that increase anxiety.  Panic disorder. What increases the risk? You are more likely to develop this condition if:  You have an anxiety disorder.  You have another mental health condition.  You take certain medicines.  You use alcohol, illegal drugs, or other substances.  You are under extreme stress.  A life event is causing increased feelings of anxiety and depression. What are the signs or symptoms? A panic attack starts suddenly, usually lasts about 20 minutes, and occurs with one or more of the following:  A pounding heart.  A feeling that your heart is beating irregularly or faster than normal (palpitations).  Sweating.  Trembling or shaking.  Shortness of breath or feeling smothered.  Feeling choked.  Chest pain or discomfort.  Nausea or a strange feeling in your stomach.  Dizziness, feeling lightheaded, or feeling like you might faint.  Chills or hot flashes.  Numbness or tingling in your lips, hands, or feet.  Feeling confused, or feeling that you are not yourself.  Fear of losing control or being emotionally unstable.  Fear of dying. How is this diagnosed? A panic attack is diagnosed with an assessment by your health care provider. During the assessment your health care provider will ask questions about:  Your history of anxiety, depression, and panic attacks.  Your medical history.  Whether you drink alcohol, use illegal drugs, take supplements, or take medicines. Be honest about your substance use. Your health care provider may also:  Order blood tests or other  kinds of tests to rule out serious medical conditions.  Refer you to a mental health professional for further evaluation. How is this treated? Treatment depends on the cause of the panic attack:  If the cause is a medical problem, your health care provider will either treat that problem or refer you to a specialist.  If the cause is emotional, you may be given anti-anxiety medicines or referred to a counselor. These medicines may reduce how often attacks happen, reduce how severe the attacks are, and lower anxiety.  If the cause is a medicine, your health care provider may tell you to stop the medicine, change your dose, or take a different medicine.  If the cause is a drug, treatment may involve letting the drug wear off and taking medicine to help the drug leave your body or to counteract its effects. Attacks caused by drug abuse may continue even if you stop using the drug. Follow these instructions at home:  Take over-the-counter and prescription medicines only as told by your health care provider.  If you feel anxious, limit your caffeine intake.  Take good care of your physical and mental health by: ? Eating a balanced diet that includes plenty of fresh fruits and vegetables, whole grains, lean meats, and low-fat dairy. ? Getting plenty of rest. Try to get 7-8 hours of uninterrupted sleep each night. ? Exercising regularly. Try to get 30 minutes of physical activity at least 5 days a week. ? Not smoking. Talk to your health care provider if you need help quitting. ? Limiting alcohol intake to no more than 1 drink a day for nonpregnant women and 2 drinks a day for men. One drink equals 12 oz of beer, 5 oz of wine, or 1 oz of hard liquor.  Keep all follow-up visits as told by your health care provider. This is important. Panic attacks may have underlying physical or emotional problems that take time to accurately diagnose. Contact a health care provider if:  Your symptoms do not  improve, or they get worse.  You are not able to take your medicine as prescribed because of side effects. Get help right away if:  You have serious thoughts about hurting yourself or others.  You have symptoms of a panic attack. Do not drive yourself to the hospital. Have someone else drive you or call an ambulance. If you ever feel like you may hurt yourself or others, or you have thoughts about taking your own life, get help right away. You can go to your nearest emergency department or call:  Your local emergency services (911 in the [place]  A suicide crisis helpline, such as the National Suicide Prevention Lifeline at 508-053-9158. This  is open 24 hours a day. Summary  A panic attack is a sign of a serious health or mental health condition. Get help right away. Do not drive yourself to the hospital. Have someone else drive you or call an ambulance.  Always see a health care provider to have the reasons for the panic attack correctly diagnosed.  If your panic attack was caused by a physical problem, follow your health care provider's suggestions for medicine, referral to a specialist, and lifestyle changes.  If your panic attack was caused by an emotional problem, follow through with counseling from a qualified mental health specialist.  If you feel like you may hurt yourself or others, call 911 and get help right away. This information is not intended to replace advice given to you by your health care provider. Make sure you discuss any questions you have with your health care provider. Document Revised: 07/08/2017 Document Reviewed: 09/03/2016 Elsevier Patient Education  2020 [employer].     [place] K. Monai Hindes M.D.

## 2020-06-12 NOTE — Telephone Encounter (Signed)
Spoke with patient. Appointment scheduled for 06-13-2020 at 2:30pm

## 2020-06-13 ENCOUNTER — Encounter: Payer: Self-pay | Admitting: Internal Medicine

## 2020-06-13 ENCOUNTER — Ambulatory Visit: Payer: BC Managed Care – PPO | Admitting: Internal Medicine

## 2020-06-13 ENCOUNTER — Other Ambulatory Visit: Payer: Self-pay

## 2020-06-13 VITALS — BP 148/86 | HR 81 | Temp 99.0°F | Ht 63.0 in | Wt 134.0 lb

## 2020-06-13 DIAGNOSIS — F419 Anxiety disorder, unspecified: Secondary | ICD-10-CM | POA: Diagnosis not present

## 2020-06-13 DIAGNOSIS — R030 Elevated blood-pressure reading, without diagnosis of hypertension: Secondary | ICD-10-CM

## 2020-06-13 DIAGNOSIS — R002 Palpitations: Secondary | ICD-10-CM | POA: Diagnosis not present

## 2020-06-13 DIAGNOSIS — Z79899 Other long term (current) drug therapy: Secondary | ICD-10-CM | POA: Diagnosis not present

## 2020-06-13 MED ORDER — LORAZEPAM 0.5 MG PO TABS: 0.5000 mg | ORAL_TABLET | Freq: Three times a day (TID) | ORAL | 0 refills | Status: DC | PRN

## 2020-06-13 NOTE — Patient Instructions (Signed)
I agree this is most llikely anxiety  Stay hydrated .   can use as needed  Lorazepam ( a benzodiazepine ) we can use  For acute panic .   consider counseling of friends and family support     Managing Anxiety, Adult After being diagnosed with an anxiety disorder, you may be relieved to know why you have felt or behaved a certain way. You may also feel overwhelmed about the treatment ahead and what it will mean for your life. With care and support, you can manage this condition and recover from it. How to manage lifestyle changes Managing stress and anxiety  Stress is your body's reaction to life changes and events, both good and bad. Most stress will last just a few hours, but stress can be ongoing and can lead to more than just stress. Although stress can play a major role in anxiety, it is not the same as anxiety. Stress is usually caused by something external, such as a deadline, test, or competition. Stress normally passes after the triggering event has ended.  Anxiety is caused by something internal, such as imagining a terrible outcome or worrying that something will go wrong that will devastate you. Anxiety often does not go away even after the triggering event is over, and it can become long-term (chronic) worry. It is important to understand the differences between stress and anxiety and to manage your stress effectively so that it does not lead to an anxious response. Talk with your health care provider or a counselor to learn more about reducing anxiety and stress. He or she may suggest tension reduction techniques, such as:  Music therapy. This can include creating or listening to music that you enjoy and that inspires you.  Mindfulness-based meditation. This involves being aware of your normal breaths while not trying to control your breathing. It can be done while sitting or walking.  Centering prayer. This involves focusing on a word, phrase, or sacred image that means something  to you and brings you peace.  Deep breathing. To do this, expand your stomach and inhale slowly through your nose. Hold your breath for 3-5 seconds. Then exhale slowly, letting your stomach muscles relax.  Self-talk. This involves identifying thought patterns that lead to anxiety reactions and changing those patterns.  Muscle relaxation. This involves tensing muscles and then relaxing them. Choose a tension reduction technique that suits your lifestyle and personality. These techniques take time and practice. Set aside 5-15 minutes a day to do them. Therapists can offer counseling and training in these techniques. The training to help with anxiety may be covered by some insurance plans. Other things you can do to manage stress and anxiety include:  Keeping a stress/anxiety diary. This can help you learn what triggers your reaction and then learn ways to manage your response.  Thinking about how you react to certain situations. You may not be able to control everything, but you can control your response.  Making time for activities that help you relax and not feeling guilty about spending your time in this way.  Visual imagery and yoga can help you stay calm and relax.  Medicines Medicines can help ease symptoms. Medicines for anxiety include:  Anti-anxiety drugs.  Antidepressants. Medicines are often used as a primary treatment for anxiety disorder. Medicines will be prescribed by a health care provider. When used together, medicines, psychotherapy, and tension reduction techniques may be the most effective treatment. Relationships Relationships can play a big part in helping  you recover. Try to spend more time connecting with trusted friends and family members. Consider going to couples counseling, taking family education classes, or going to family therapy. Therapy can help you and others better understand your condition. How to recognize changes in your anxiety Everyone responds  differently to treatment for anxiety. Recovery from anxiety happens when symptoms decrease and stop interfering with your daily activities at home or work. This may mean that you will start to:  Have better concentration and focus. Worry will interfere less in your daily thinking.  Sleep better.  Be less irritable.  Have more energy.  Have improved memory. It is important to recognize when your condition is getting worse. Contact your health care provider if your symptoms interfere with home or work and you feel like your condition is not improving. Follow these instructions at home: Activity  Exercise. Most adults should do the following: ? Exercise for at least 150 minutes each week. The exercise should increase your heart rate and make you sweat (moderate-intensity exercise). ? Strengthening exercises at least twice a week.  Get the right amount and quality of sleep. Most adults need 7-9 hours of sleep each night. Lifestyle   Eat a healthy diet that includes plenty of vegetables, fruits, whole grains, low-fat dairy products, and lean protein. Do not eat a lot of foods that are high in solid fats, added sugars, or salt.  Make choices that simplify your life.  Do not use any products that contain nicotine or tobacco, such as cigarettes, e-cigarettes, and chewing tobacco. If you need help quitting, ask your health care provider.  Avoid caffeine, alcohol, and certain over-the-counter cold medicines. These may make you feel worse. Ask your pharmacist which medicines to avoid. General instructions  Take over-the-counter and prescription medicines only as told by your health care provider.  Keep all follow-up visits as told by your health care provider. This is important. Where to find support You can get help and support from these sources:  Self-help groups.  Online and [employer].  A trusted spiritual leader.  Couples counseling.  Family education  classes.  Family therapy. Where to find more information You may find that joining a support group helps you deal with your anxiety. The following sources can help you locate counselors or support groups near you:  Mental Health America: www.mentalhealthamerica.net  Anxiety and Depression Association of [place] (ADAA): ProgramCam.de  [employer] on Mental Illness (NAMI): www.nami.org Contact a health care provider if you:  Have a hard time staying focused or finishing daily tasks.  Spend many hours a day feeling worried about everyday life.  Become exhausted by worry.  Start to have headaches, feel tense, or have nausea.  Urinate more than normal.  Have diarrhea. Get help right away if you have:  A racing heart and shortness of breath.  Thoughts of hurting yourself or others. If you ever feel like you may hurt yourself or others, or have thoughts about taking your own life, get help right away. You can go to your nearest emergency department or call:  Your local emergency services (911 in the [place]  A suicide crisis helpline, such as the National Suicide Prevention Lifeline at 870-839-8652. This is open 24 hours a day. Summary  Taking steps to learn and use tension reduction techniques can help calm you and help prevent triggering an anxiety reaction.  When used together, medicines, psychotherapy, and tension reduction techniques may be the most effective treatment.  Family, friends, and partners  can play a big part in helping you recover from an anxiety disorder. This information is not intended to replace advice given to you by your health care provider. Make sure you discuss any questions you have with your health care provider. Document Revised: 12/26/2018 Document Reviewed: 12/26/2018 Elsevier Patient Education  2020 Elsevier Inc. Panic Attack A panic attack is a sudden episode of severe anxiety, fear, or discomfort that causes physical and emotional  symptoms. The attack may be in response to something frightening, or it may occur for no known reason. Symptoms of a panic attack can be similar to symptoms of a heart attack or stroke. It is important to see your health care provider when you have a panic attack so that these conditions can be ruled out. A panic attack is a symptom of another condition. Most panic attacks go away with treatment of the underlying problem. If you have panic attacks often, you may have a condition called panic disorder. What are the causes? A panic attack may be caused by:  An extreme, life-threatening situation, such as a war or natural disaster.  An anxiety disorder, such as post-traumatic stress disorder.  Depression.  Certain medical conditions, including heart problems, neurological conditions, and infections.  Certain over-the-counter and prescription medicines.  Illegal drugs that increase heart rate and blood pressure, such as methamphetamine.  Alcohol.  Supplements that increase anxiety.  Panic disorder. What increases the risk? You are more likely to develop this condition if:  You have an anxiety disorder.  You have another mental health condition.  You take certain medicines.  You use alcohol, illegal drugs, or other substances.  You are under extreme stress.  A life event is causing increased feelings of anxiety and depression. What are the signs or symptoms? A panic attack starts suddenly, usually lasts about 20 minutes, and occurs with one or more of the following:  A pounding heart.  A feeling that your heart is beating irregularly or faster than normal (palpitations).  Sweating.  Trembling or shaking.  Shortness of breath or feeling smothered.  Feeling choked.  Chest pain or discomfort.  Nausea or a strange feeling in your stomach.  Dizziness, feeling lightheaded, or feeling like you might faint.  Chills or hot flashes.  Numbness or tingling in your lips,  hands, or feet.  Feeling confused, or feeling that you are not yourself.  Fear of losing control or being emotionally unstable.  Fear of dying. How is this diagnosed? A panic attack is diagnosed with an assessment by your health care provider. During the assessment your health care provider will ask questions about:  Your history of anxiety, depression, and panic attacks.  Your medical history.  Whether you drink alcohol, use illegal drugs, take supplements, or take medicines. Be honest about your substance use. Your health care provider may also:  Order blood tests or other kinds of tests to rule out serious medical conditions.  Refer you to a mental health professional for further evaluation. How is this treated? Treatment depends on the cause of the panic attack:  If the cause is a medical problem, your health care provider will either treat that problem or refer you to a specialist.  If the cause is emotional, you may be given anti-anxiety medicines or referred to a counselor. These medicines may reduce how often attacks happen, reduce how severe the attacks are, and lower anxiety.  If the cause is a medicine, your health care provider may tell you to stop the  medicine, change your dose, or take a different medicine.  If the cause is a drug, treatment may involve letting the drug wear off and taking medicine to help the drug leave your body or to counteract its effects. Attacks caused by drug abuse may continue even if you stop using the drug. Follow these instructions at home:  Take over-the-counter and prescription medicines only as told by your health care provider.  If you feel anxious, limit your caffeine intake.  Take good care of your physical and mental health by: ? Eating a balanced diet that includes plenty of fresh fruits and vegetables, whole grains, lean meats, and low-fat dairy. ? Getting plenty of rest. Try to get 7-8 hours of uninterrupted sleep each  night. ? Exercising regularly. Try to get 30 minutes of physical activity at least 5 days a week. ? Not smoking. Talk to your health care provider if you need help quitting. ? Limiting alcohol intake to no more than 1 drink a day for nonpregnant women and 2 drinks a day for men. One drink equals 12 oz of beer, 5 oz of wine, or 1 oz of hard liquor.  Keep all follow-up visits as told by your health care provider. This is important. Panic attacks may have underlying physical or emotional problems that take time to accurately diagnose. Contact a health care provider if:  Your symptoms do not improve, or they get worse.  You are not able to take your medicine as prescribed because of side effects. Get help right away if:  You have serious thoughts about hurting yourself or others.  You have symptoms of a panic attack. Do not drive yourself to the hospital. Have someone else drive you or call an ambulance. If you ever feel like you may hurt yourself or others, or you have thoughts about taking your own life, get help right away. You can go to your nearest emergency department or call:  Your local emergency services (911 in the [place]  A suicide crisis helpline, such as the National Suicide Prevention Lifeline at 510-539-0270. This is open 24 hours a day. Summary  A panic attack is a sign of a serious health or mental health condition. Get help right away. Do not drive yourself to the hospital. Have someone else drive you or call an ambulance.  Always see a health care provider to have the reasons for the panic attack correctly diagnosed.  If your panic attack was caused by a physical problem, follow your health care provider's suggestions for medicine, referral to a specialist, and lifestyle changes.  If your panic attack was caused by an emotional problem, follow through with counseling from a qualified mental health specialist.  If you feel like you may hurt yourself or others, call 911  and get help right away. This information is not intended to replace advice given to you by your health care provider. Make sure you discuss any questions you have with your health care provider. Document Revised: 07/08/2017 Document Reviewed: 09/03/2016 Elsevier Patient Education  2020 [employer].

## 2020-06-18 ENCOUNTER — Other Ambulatory Visit: Payer: Self-pay | Admitting: Obstetrics and Gynecology

## 2020-06-18 DIAGNOSIS — Z6823 Body mass index (BMI) 23.0-23.9, adult: Secondary | ICD-10-CM | POA: Diagnosis not present

## 2020-06-18 DIAGNOSIS — F411 Generalized anxiety disorder: Secondary | ICD-10-CM | POA: Diagnosis not present

## 2020-06-23 ENCOUNTER — Telehealth: Payer: Self-pay

## 2020-06-23 NOTE — Telephone Encounter (Signed)
Need visit  to decide . Could do video if    No ov available

## 2020-06-23 NOTE — Telephone Encounter (Signed)
Spoke with patient she has been having what she feels like is "gallbladder" pains.   Begin on 06/19/2020.    She noticed after eating pain getting little worse.   Pain level not very high.   Very concerned.   Please advise

## 2020-06-23 NOTE — Telephone Encounter (Signed)
Patient called in wanting nurse/povider  to call her back she is having pain under her right side under the ribcage. Feels like a ball.     Please call and advise

## 2020-06-24 NOTE — Telephone Encounter (Signed)
Patient is schedule to be seen on 06/25/2020

## 2020-06-24 NOTE — Progress Notes (Signed)
Chief Complaint  Patient presents with  . Follow-up    anxiety doing better, gallbladder pain is better    HPI: [name] 44 y.o. come in for new sx acite pain she feels could be from GB disease  See message onset  47 12  Ache after eating  Right flank and uq 2/10  .  Lasted    About    15  Minutes  Lated   No fever    Right flank . Marland Kitchen  Currently she is not having pain but a lot of anxiety. She only took 1 benzo and did not think it helped her that much from last visit Supposed to  travel at Thanksgiving to the [place].  Saw her GYN who did labs a CMP thyroid and a CBC without significant abnormality is low her eos were up a bit.  Was told this is reassuring She does have a hx of ureteral stone.  Negative family history of gallbladder. She just feels very anxious she has not had time to check her blood pressure at home yet family history of hypertension especially her father.  Blood pressure reading at GYN was normal in the 120 range she reports.  ROS: See pertinent positives and negatives per HPI. Her son had Covid in the past but she never had a symptom with tested negative wonders if any of her symptoms could be from Covid.  She is immunized Past Medical History:  Diagnosis Date  . Borderline hypertension   . Endometriosis    surgery 2009  . Frequency of urination   . Hx of varicella   . Left ureteral stone   . Migraine   . PONV (postoperative nausea and vomiting)   . Urgency of urination     Family History  Problem Relation Age of Onset  . Hypertension Mother   . Breast cancer Mother        age 59   . Heart failure Mother        after breast cancer rx has icdalso   . Hypertension Father   . Healthy Son   . Healthy Son     Social History   Socioeconomic History  . Marital status: Married    Spouse name: Not on file  . Number of children: Not on file  . Years of education: Not on file  . Highest education level: Not on file  Occupational History    . Not on file  Tobacco Use  . Smoking status: Never Smoker  . Smokeless tobacco: Never Used  Vaping Use  . Vaping Use: Never used  Substance and Sexual Activity  . Alcohol use: Yes    Alcohol/week: 0.0 standard drinks    Comment: social  . Drug use: No  . Sexual activity: Yes    Partners: Male    Birth control/protection: I.U.D.    Comment: Mirena IUD placed 2016 approx  Other Topics Concern  . Not on file  Social History Narrative   6-8 hours of sleep per night   Does not work outside home   Married for 12 years with 2 children   Children are 79yrs and 5 yrs.   No pets   etoh social    Hot yoga  And walking    g2 p2   Born raised  [place] heritage [employer] degree [employer] [place]   Social Determinants of Health   Financial Resource Strain:   . Difficulty of Paying Living Expenses:  Not on file  Food Insecurity:   . Worried About Programme researcher, broadcasting/film/video in the Last Year: Not on file  . Ran Out of Food in the Last Year: Not on file  Transportation Needs:   . Lack of Transportation (Medical): Not on file  . Lack of Transportation (Non-Medical): Not on file  Physical Activity:   . Days of Exercise per Week: Not on file  . Minutes of Exercise per Session: Not on file  Stress:   . Feeling of Stress : Not on file  Social Connections:   . Frequency of Communication with Friends and Family: Not on file  . Frequency of Social Gatherings with Friends and Family: Not on file  . Attends Religious Services: Not on file  . Active Member of Clubs or Organizations: Not on file  . Attends Banker Meetings: Not on file  . Marital Status: Not on file    Outpatient Medications Prior to Visit  Medication Sig Dispense Refill  . Biotin 5000 MCG CAPS Take 1 capsule by mouth daily.     . Collagen Hydrolysate POWD 1 Scoop daily    . Diclofenac Potassium (ZIPSOR) 25 MG CAPS Take 1 every 8 hours as needed for migraine headache. 30 capsule 2  . levonorgestrel  (MIRENA) 20 MCG/24HR IUD by Intrauterine route.    Marland Kitchen LORazepam (ATIVAN) 0.5 MG tablet Take 1-2 tablets (0.5-1 mg total) by mouth every 8 (eight) hours as needed for anxiety. 20 tablet 0  . Magnesium 400 MG CAPS Take 1 tablet by mouth daily.    . metroNIDAZOLE (METROCREAM) 0.75 % cream as needed.    . Multiple Vitamins-Minerals (HAIR VITAMINS) TABS Take 1 tablet by mouth daily.    . Naproxen Sodium (ALEVE) 220 MG CAPS Take by mouth as needed.    . Probiotic Product (PROBIOTIC DAILY) CAPS Take 1 capsule by mouth daily.    Marland Kitchen tretinoin (RETIN-A) 0.05 % cream tretinoin 0.05 % topical cream  APPLY TO AFFECTED AREA AS NEEDED    . ZOLMitriptan (ZOMIG) 2.5 MG tablet Take 1 tablet (2.5 mg total) by mouth as needed for migraine. 10 tablet 2   No facility-administered medications prior to visit.     EXAM:  BP (!) 160/88   Pulse 95   Temp 99.6 F (37.6 C) (Oral)   Ht 5\' 3"  (1.6 m)   Wt 133 lb 3.2 oz (60.4 kg)   SpO2 99%   BMI 23.60 kg/m   Body mass index is 23.6 kg/m. Blood pressure repeat was 158/93 and 160 over90 GENERAL: vitals reviewed and listed above, alert, oriented, appears well hydrated and in no acute distress HEENT: atraumatic, conjunctiva  clear, no obvious abnormalities on inspection of external nose and ears OP : Masked NECK: no obvious masses on inspection palpation  LUNGS: clear to auscultation bilaterally, no wheezes, rales or rhonchi, good air movement CV: HRRR, no clubbing cyanosis or  peripheral edema nl cap refill  Abdomen soft without organomegaly guarding or rebound but she points to the area of the right upper quadrant flank side as the area of discomfort that she had had. MS: moves all extremities without noticeable focal  abnormality PSYCH: pleasant and cooperative, there is some anxiety but no obvious depression Lab Results  Component Value Date   WBC 5.5 12/31/2019   HGB 13.9 12/31/2019   HCT 40.4 12/31/2019   PLT 201.0 12/31/2019   GLUCOSE 76 12/31/2019    CHOL 173 12/31/2019   TRIG 75.0 12/31/2019  HDL 52.20 12/31/2019   LDLCALC 106 (H) 12/31/2019   ALT 18 12/31/2019   AST 20 12/31/2019   NA 139 12/31/2019   K 4.0 12/31/2019   CL 104 12/31/2019   CREATININE 0.90 12/31/2019   BUN 21 12/31/2019   CO2 26 12/31/2019   TSH 1.55 12/31/2019   HGBA1C 4.9 12/31/2019   BP Readings from Last 3 Encounters:  06/25/20 (!) 160/88  06/13/20 (!) 148/86  01/02/20 128/84   Reviewed lab test from her phone from her GYN portal. ASSESSMENT AND PLAN:  Discussed the following assessment and plan:  Right flank pain - post prandial   not resolved  check US renal and biliary  - Plan: US Abdomen Complete, POCT URINALYSIS DIP (CLINITEK)  Personal history of kidney stones - Plan: US Abdomen Complete  Elevated blood pressure reading in office without diagnosis of hypertension Pain is not impressive at this point and better urinalysis is clear without hematuria. Differential diagnosis certainly could include biliary pain and perhaps even renal stones however no acute findings today.  Is reassuring that her renal function thyroid etc. is normal. In regard to her anxiety and palpitations is a probably related. Elevated blood pressure readings today and at last visit need to be followed up would add medication if continued elevated interesting that she had a normal blood pressure at the GYNs office. She will send in blood pressure readings by the end of the week we can make a decision about considering adding metoprolol if appropriate.  Her anxiety is adding , e did not states discuss controller medicine at this time we will do more of a work-up and then decide.  Consider echocardiogram and cardiology consult.  For rhythm monitoring.  She does have a autoimmune predisposition.  -Patient advised to return or notify health care team  if  new concerns arise.  Patient Instructions  Will be contact about abd ultrasound to check liver gall bladder  and kidney  . Still need  Reassurance that bp is in range    Send in some readings   This week and next  We can consider adding  Low dose metoprolol that is  Helpful for palpitations   High blood pressure  And heart rate .   Glad  Your  Kidney function and   Blood count and thyroid are all normal .      Burna Mortimer K. Kramer Hanrahan M.D.

## 2020-06-25 ENCOUNTER — Ambulatory Visit: Payer: BC Managed Care – PPO | Admitting: Internal Medicine

## 2020-06-25 ENCOUNTER — Encounter: Payer: Self-pay | Admitting: Internal Medicine

## 2020-06-25 ENCOUNTER — Other Ambulatory Visit: Payer: Self-pay

## 2020-06-25 VITALS — BP 160/88 | HR 95 | Temp 99.6°F | Ht 63.0 in | Wt 133.2 lb

## 2020-06-25 DIAGNOSIS — R109 Unspecified abdominal pain: Secondary | ICD-10-CM

## 2020-06-25 DIAGNOSIS — R030 Elevated blood-pressure reading, without diagnosis of hypertension: Secondary | ICD-10-CM | POA: Diagnosis not present

## 2020-06-25 DIAGNOSIS — Z87442 Personal history of urinary calculi: Secondary | ICD-10-CM

## 2020-06-25 LAB — POCT URINALYSIS DIP (CLINITEK)
Bilirubin, UA: NEGATIVE
Blood, UA: NEGATIVE
Glucose, UA: NEGATIVE mg/dL
Ketones, POC UA: NEGATIVE mg/dL
Leukocytes, UA: NEGATIVE
Nitrite, UA: NEGATIVE
POC PROTEIN,UA: NEGATIVE
Spec Grav, UA: 1.015 (ref 1.010–1.025)
Urobilinogen, UA: 0.2 E.U./dL
pH, UA: 7.5 (ref 5.0–8.0)

## 2020-06-25 NOTE — Patient Instructions (Addendum)
Will be contact about abd ultrasound to check liver gall bladder  and kidney . Still need  Reassurance that bp is in range    Send in some readings   This week and next  We can consider adding  Low dose metoprolol that is  Helpful for palpitations   High blood pressure  And heart rate .   Glad  Your  Kidney function and   Blood count and thyroid are all normal .

## 2020-06-26 DIAGNOSIS — Z780 Asymptomatic menopausal state: Secondary | ICD-10-CM | POA: Diagnosis not present

## 2020-06-27 ENCOUNTER — Telehealth: Payer: Self-pay

## 2020-06-27 DIAGNOSIS — R002 Palpitations: Secondary | ICD-10-CM

## 2020-06-27 DIAGNOSIS — Z8249 Family history of ischemic heart disease and other diseases of the circulatory system: Secondary | ICD-10-CM

## 2020-06-27 DIAGNOSIS — R030 Elevated blood-pressure reading, without diagnosis of hypertension: Secondary | ICD-10-CM

## 2020-06-27 DIAGNOSIS — Z0184 Encounter for antibody response examination: Secondary | ICD-10-CM

## 2020-06-27 MED ORDER — METOPROLOL SUCCINATE ER 25 MG PO TB24: 12.5000 mg | ORAL_TABLET | Freq: Every day | ORAL | 0 refills | Status: DC

## 2020-06-27 NOTE — Telephone Encounter (Signed)
Please see previous my chart message from 06/27/2020.   Called and discussed with patient.  No further questions

## 2020-06-27 NOTE — Telephone Encounter (Signed)
Yes  They are much better although the diastolic ( bottom number  Would be best  80 and below)  I will put in order for  covid abys but they are not predictive of anything such as immunity  Or what is going on .  I am going to order an echo cardiogram  And  Get a referral  To cardiology to get  More opinoin  About the palpitations    So we can  Have less anxiety moving forward.   Stay hydrated     I will send in low dose metoprolol a BP and rhythm medication   That  You can chose to begin and may help with palpitations   But   Ok to not take at this time   .

## 2020-06-28 DIAGNOSIS — Z20822 Contact with and (suspected) exposure to covid-19: Secondary | ICD-10-CM | POA: Diagnosis not present

## 2020-06-30 NOTE — Telephone Encounter (Signed)
I think I answered this on another message thread. Antibody studies currently available do not equate with immunity possibly with past infection.  But I placed the orders for future labs if needed if you want to proceed with this.  Make a lab appointment.

## 2020-07-08 ENCOUNTER — Telehealth (INDEPENDENT_AMBULATORY_CARE_PROVIDER_SITE_OTHER): Payer: BC Managed Care – PPO | Admitting: Internal Medicine

## 2020-07-08 ENCOUNTER — Encounter: Payer: Self-pay | Admitting: Internal Medicine

## 2020-07-08 VITALS — Temp 98.3°F

## 2020-07-08 DIAGNOSIS — R002 Palpitations: Secondary | ICD-10-CM

## 2020-07-08 DIAGNOSIS — Z8249 Family history of ischemic heart disease and other diseases of the circulatory system: Secondary | ICD-10-CM | POA: Diagnosis not present

## 2020-07-08 DIAGNOSIS — R030 Elevated blood-pressure reading, without diagnosis of hypertension: Secondary | ICD-10-CM

## 2020-07-08 DIAGNOSIS — R109 Unspecified abdominal pain: Secondary | ICD-10-CM

## 2020-07-08 DIAGNOSIS — F419 Anxiety disorder, unspecified: Secondary | ICD-10-CM | POA: Diagnosis not present

## 2020-07-08 NOTE — Telephone Encounter (Signed)
Pt had virtual visit with pcp.

## 2020-07-08 NOTE — Progress Notes (Signed)
Virtual Visit via Video Note  I connected with@ on 07/08/20 at 11:00 AM EST by a video enabled telemedicine application and verified that I am speaking with the correct person using two identifiers. Location patient: home Location provider:work  office Persons participating in the virtual visit: patient, provider  [place]th national recommendations  regarding COVID 19 pandemic   video visit is advised over in office visit for this patient.  Patient aware  of the limitations of evaluation and management by telemedicine and  availability of in person appointments. and agreed to proceed.   HPI: [name] presents for video visit follow-up for palpitations elevated blood pressure anxiety reactions.  Doing better   Went on vacation  To [place]    Took out iud cause felt it was related .  Was in for 10 years   Last one  A year ago. goggled anxiety and  iud .  And after she self removed it has felt Less anxiety    Mild headache  Sleeping though the night    ocass dizziness   bp  Is better  No med   racing anxiety is less.  A.m. blood pressure today is  138/85     Less frequent  Palpitations .  Took antibiotic  Before  Trip from friend who is a MD  macrobid because of lower abdominal symptoms even though our urine was clear.  Not sure if it helped. Has appointment with GYN tomorrow.  She was on the IUD for endometriosis. Has a follow-up planned ultrasound scheduled for this week and has to make an appointment with cardiology was reached out to her.   ROS: See pertinent positives and negatives per HPI.  Past Medical History:  Diagnosis Date  . Borderline hypertension   . Endometriosis    surgery 2009  . Frequency of urination   . Hx of varicella   . Left ureteral stone   . Migraine   . PONV (postoperative nausea and vomiting)   . Urgency of urination     Past Surgical History:  Procedure Laterality Date  . COLONOSCOPY  10/11/2018  . CYSTOSCOPY/RETROGRADE/URETEROSCOPY/STONE  EXTRACTION WITH BASKET Left 02/13/2016   Procedure: CYSTOSCOPY/RETROGRADE/URETEROSCOPY/STONE EXTRACTION WITH BASKET;  Surgeon: Marcine Matar, MD;  Location: [facility];  Service: Urology;  Laterality: Left;  . LAPAROSCOPY LYSIS ADHESIONS/  ABLATION AND BX'S OF ENDOMETRIAL LESIONS/  RIGHT FIMBROPLASTY  10-02-2007  . NASAL SEPTUM SURGERY  02/ 2016  . TONSILLECTOMY  1985    Family History  Problem Relation Age of Onset  . Hypertension Mother   . Breast cancer Mother        age 11   . Heart failure Mother        after breast cancer rx has icdalso   . Hypertension Father   . Healthy Son   . Healthy Son     Social History   Tobacco Use  . Smoking status: Never Smoker  . Smokeless tobacco: Never Used  Vaping Use  . Vaping Use: Never used  Substance Use Topics  . Alcohol use: Yes    Alcohol/week: 0.0 standard drinks    Comment: social  . Drug use: No      Current Outpatient Medications:  .  Biotin 5000 MCG CAPS, Take 1 capsule by mouth daily. , Disp: , Rfl:  .  Collagen Hydrolysate POWD, 1 Scoop daily, Disp: , Rfl:  .  Diclofenac Potassium (ZIPSOR) 25 MG CAPS, Take 1 every 8 hours as needed for  migraine headache., Disp: 30 capsule, Rfl: 2 .  hydrOXYzine (VISTARIL) 25 MG capsule, hydroxyzine pamoate 25 mg capsule, Disp: , Rfl:  .  levonorgestrel (MIRENA) 20 MCG/24HR IUD, by Intrauterine route., Disp: , Rfl:  .  LORazepam (ATIVAN) 0.5 MG tablet, Take 1-2 tablets (0.5-1 mg total) by mouth every 8 (eight) hours as needed for anxiety., Disp: 20 tablet, Rfl: 0 .  Magnesium 400 MG CAPS, Take 1 tablet by mouth daily., Disp: , Rfl:  .  metoprolol succinate (TOPROL-XL) 25 MG 24 hr tablet, Take 0.5 tablets (12.5 mg total) by mouth daily., Disp: 30 tablet, Rfl: 0 .  metroNIDAZOLE (METROCREAM) 0.75 % cream, as needed., Disp: , Rfl:  .  Multiple Vitamins-Minerals (HAIR VITAMINS) TABS, Take 1 tablet by mouth daily., Disp: , Rfl:  .  Naproxen Sodium (ALEVE) 220 MG CAPS, Take  by mouth as needed., Disp: , Rfl:  .  Probiotic Product (PROBIOTIC DAILY) CAPS, Take 1 capsule by mouth daily., Disp: , Rfl:  .  tretinoin (RETIN-A) 0.05 % cream, tretinoin 0.05 % topical cream  APPLY TO AFFECTED AREA AS NEEDED, Disp: , Rfl:  .  ZOLMitriptan (ZOMIG) 2.5 MG tablet, Take 1 tablet (2.5 mg total) by mouth as needed for migraine., Disp: 10 tablet, Rfl: 2  EXAM: BP Readings from Last 3 Encounters:  06/25/20 (!) 160/88  06/13/20 (!) 148/86  01/02/20 128/84    VITALS per patient if applicable: Blood pressure 138/85  GENERAL: alert, oriented, appears well and in no acute distress  HEENT: atraumatic, conjunttiva clear, no obvious abnormalities on inspection of external nose and ears  NECK: normal movements of the head and neck  LUNGS: on inspection no signs of respiratory distress, breathing rate appears normal, no obvious gross SOB, gasping or wheezing  CV: no obvious cyanosis  MS: moves all visible extremities without noticeable abnormality  PSYCH/NEURO: pleasant and cooperative, animated but not anxious speech and thought processing grossly intact Lab Results  Component Value Date   WBC 5.5 12/31/2019   HGB 13.9 12/31/2019   HCT 40.4 12/31/2019   PLT 201.0 12/31/2019   GLUCOSE 76 12/31/2019   CHOL 173 12/31/2019   TRIG 75.0 12/31/2019   HDL 52.20 12/31/2019   LDLCALC 106 (H) 12/31/2019   ALT 18 12/31/2019   AST 20 12/31/2019   NA 139 12/31/2019   K 4.0 12/31/2019   CL 104 12/31/2019   CREATININE 0.90 12/31/2019   BUN 21 12/31/2019   CO2 26 12/31/2019   TSH 1.55 12/31/2019   HGBA1C 4.9 12/31/2019    ASSESSMENT AND PLAN:  Discussed the following assessment and plan:    ICD-10-CM   1. Elevated blood pressure reading in office without diagnosis of hypertension  R03.0   2. Anxiety  F41.9   3. Palpitation  R00.2   4. Family history of heart disease  Z82.49   5. Right flank pain  R10.9    Anxiety better  Feels  mirena could  have been a trigger   .   Hx of endometriosis uncertain contributor to her back and lower pain currently improved. Blood pressure still in stage I level and does have a family history she is not on medication at this time proceed with echocardiogram cardiology  She has not taken metoprolol at this point.  Plan follow-up after evaluations done perhaps in January or as indicated. Counseled.   Expectant management and discussion of plan and treatment with opportunity to ask questions and all were answered. The patient agreed with the plan  and demonstrated an understanding of the instructions.   Advised to call back or seek an in-person evaluation if worsening  or having  further concerns . Return for After evaluations perhaps January.    Berniece Andreas, MD

## 2020-07-09 DIAGNOSIS — Z3009 Encounter for other general counseling and advice on contraception: Secondary | ICD-10-CM | POA: Diagnosis not present

## 2020-07-10 ENCOUNTER — Other Ambulatory Visit: Payer: BC Managed Care – PPO

## 2020-07-16 ENCOUNTER — Ambulatory Visit: Payer: BC Managed Care – PPO | Admitting: Cardiology

## 2020-07-23 ENCOUNTER — Other Ambulatory Visit: Payer: Self-pay

## 2020-07-23 ENCOUNTER — Ambulatory Visit: Payer: BC Managed Care – PPO | Admitting: Adult Health

## 2020-07-23 ENCOUNTER — Other Ambulatory Visit: Payer: BC Managed Care – PPO

## 2020-07-23 ENCOUNTER — Encounter: Payer: Self-pay | Admitting: Adult Health

## 2020-07-23 VITALS — BP 130/90 | HR 81 | Temp 99.0°F | Resp 18 | Wt 131.4 lb

## 2020-07-23 DIAGNOSIS — R0989 Other specified symptoms and signs involving the circulatory and respiratory systems: Secondary | ICD-10-CM

## 2020-07-23 NOTE — Progress Notes (Signed)
Subjective:    Patient ID: [name], female    DOB: 04-Sep-1975, 44 y.o.   MRN: [id number]  HPI 44 year old female who  has a past medical history of Borderline hypertension, Endometriosis, Frequency of urination, varicella, Left ureteral stone, Migraine, PONV (postoperative nausea and vomiting), and Urgency of urination.  She presents to the office today for an acute issue of concern of blood clot in her left upper leg. She received her [place] [place] and [place] booster vaccination about one week ago. In the meantime she noticed a vein in her upper left leg sticking out. She wants to make sure she does not have a blood clot.   She denies chest pain, shortness of breath, swelling, redness, warmth, or pain at the site of concern   Review of Systems See HPI   Past Medical History:  Diagnosis Date   Borderline hypertension    Endometriosis    surgery 2009   Frequency of urination    Hx of varicella    Left ureteral stone    Migraine    PONV (postoperative nausea and vomiting)    Urgency of urination     Social History   Socioeconomic History   Marital status: Married    Spouse name: Not on file   Number of children: Not on file   Years of education: Not on file   Highest education level: Not on file  Occupational History   Not on file  Tobacco Use   Smoking status: Never Smoker   Smokeless tobacco: Never Used  Vaping Use   Vaping Use: Never used  Substance and Sexual Activity   Alcohol use: Yes    Alcohol/week: 0.0 standard drinks    Comment: social   Drug use: No   Sexual activity: Yes    Partners: Male    Birth control/protection: I.U.D.    Comment: Mirena IUD placed 2016 approx  Other Topics Concern   Not on file  Social History Narrative   6-8 hours of sleep per night   Does not work outside home   Married for 12 years with 2 children   Children are 39yrs and 5 yrs.   No pets   etoh social    Hot yoga  And walking    g2 p2   Born  raised  [place]   College degree univ [place]   Social Determinants of Health   Financial Resource Strain: Not on file  Food Insecurity: Not on file  Transportation Needs: Not on file  Physical Activity: Not on file  Stress: Not on file  Social Connections: Not on file  Intimate Partner Violence: Not on file    Past Surgical History:  Procedure Laterality Date   COLONOSCOPY  10/11/2018   CYSTOSCOPY/RETROGRADE/URETEROSCOPY/STONE EXTRACTION WITH BASKET Left 02/13/2016   Procedure: CYSTOSCOPY/RETROGRADE/URETEROSCOPY/STONE EXTRACTION WITH BASKET;  Surgeon: Marcine Matar, MD;  Location: [facility];  Service: Urology;  Laterality: Left;   LAPAROSCOPY LYSIS ADHESIONS/  ABLATION AND BX'S OF ENDOMETRIAL LESIONS/  RIGHT FIMBROPLASTY  10-02-2007   NASAL SEPTUM SURGERY  02/ 2016   TONSILLECTOMY  1985    Family History  Problem Relation Age of Onset   Hypertension Mother    Breast cancer Mother        age 20    Heart failure Mother        after breast cancer rx has icdalso    Hypertension Father    Healthy Son  Healthy Son     No Known Allergies  Current Outpatient Medications on File Prior to Visit  Medication Sig Dispense Refill   Biotin 5000 MCG CAPS Take 1 capsule by mouth daily.      Collagen Hydrolysate POWD 1 Scoop daily     Diclofenac Potassium (ZIPSOR) 25 MG CAPS Take 1 every 8 hours as needed for migraine headache. 30 capsule 2   hydrOXYzine (VISTARIL) 25 MG capsule hydroxyzine pamoate 25 mg capsule     Magnesium 400 MG CAPS Take 1 tablet by mouth daily.     metroNIDAZOLE (METROCREAM) 0.75 % cream as needed.     Multiple Vitamins-Minerals (HAIR VITAMINS) TABS Take 1 tablet by mouth daily.     Naproxen Sodium 220 MG CAPS Take by mouth as needed.     Probiotic Product (PROBIOTIC DAILY) CAPS Take 1 capsule by mouth daily.     tretinoin (RETIN-A) 0.05 % cream tretinoin 0.05 % topical cream  APPLY TO  AFFECTED AREA AS NEEDED     ZOLMitriptan (ZOMIG) 2.5 MG tablet Take 1 tablet (2.5 mg total) by mouth as needed for migraine. 10 tablet 2   metoprolol succinate (TOPROL-XL) 25 MG 24 hr tablet Take 0.5 tablets (12.5 mg total) by mouth daily. (Patient not taking: Reported on 07/23/2020) 30 tablet 0   No current facility-administered medications on file prior to visit.    BP 130/90    Pulse 81    Temp 99 F (37.2 C)    Resp 18    Wt 131 lb 6.4 oz (59.6 kg)    LMP  (LMP Unknown)    SpO2 98%    BMI 23.28 kg/m       Objective:   Physical Exam Vitals and nursing note reviewed.  Constitutional:      Appearance: Normal appearance.  Cardiovascular:     Comments: Superficial vein felt on left upper leg. Appears to have good blood flow. No pain with palpation, redness, warmth, or edema noted Musculoskeletal:        General: Normal range of motion.  Skin:    General: Skin is warm and dry.  Neurological:     General: No focal deficit present.     Mental Status: She is alert and oriented to person, place, and time.  Psychiatric:        Mood and Affect: Mood normal.        Behavior: Behavior normal.        Thought Content: Thought content normal.        Judgment: Judgment normal.       Assessment & Plan:  1. Vein symptom - appears what she is feeling is a valve in her vein. No concern for phlebitis or blood clot. Reassurance given   Shirline Frees, NP

## 2020-07-29 DIAGNOSIS — M79605 Pain in left leg: Secondary | ICD-10-CM | POA: Diagnosis not present

## 2020-08-05 ENCOUNTER — Telehealth: Payer: Self-pay | Admitting: Internal Medicine

## 2020-08-05 NOTE — Telephone Encounter (Signed)
Pt is calling in to speak with someone about a personal matter for a referral to a endocrinologist

## 2020-08-05 NOTE — Telephone Encounter (Signed)
Patient called, left VM to return the call to the office. 

## 2020-08-06 NOTE — Telephone Encounter (Signed)
Called and lvm for patient call back. 

## 2020-08-19 ENCOUNTER — Other Ambulatory Visit: Payer: Self-pay | Admitting: Internal Medicine

## 2020-08-20 ENCOUNTER — Ambulatory Visit (HOSPITAL_COMMUNITY): Payer: BC Managed Care – PPO | Attending: Cardiology

## 2020-08-20 ENCOUNTER — Other Ambulatory Visit: Payer: Self-pay

## 2020-08-20 DIAGNOSIS — Z8249 Family history of ischemic heart disease and other diseases of the circulatory system: Secondary | ICD-10-CM

## 2020-08-20 DIAGNOSIS — R030 Elevated blood-pressure reading, without diagnosis of hypertension: Secondary | ICD-10-CM | POA: Insufficient documentation

## 2020-08-20 DIAGNOSIS — R002 Palpitations: Secondary | ICD-10-CM

## 2020-08-20 LAB — ECHOCARDIOGRAM COMPLETE
Area-P 1/2: 3.4 cm2
S' Lateral: 2.6 cm

## 2020-08-20 NOTE — Progress Notes (Signed)
Good news  totally normal echo  valves and muscle heart function  are normal.

## 2020-08-26 ENCOUNTER — Ambulatory Visit: Payer: BC Managed Care – PPO | Admitting: Cardiology

## 2020-09-03 IMAGING — CT CT HEART SCORING
2 series · 16 of 20 positions shown, 18 images · non-contrast
Comparison: None

Addendum:
EXAM:
OVER-READ INTERPRETATION  CT CHEST

The following report is an over-read performed by radiologist Dr.
over-read does not include interpretation of cardiac or coronary
anatomy or pathology. The coronary calcium score interpretation by
the cardiologist is attached.
CLINICAL DATA: Risk stratification
Coronary Calcium Score
TECHNIQUE: The patient was scanned on a Siemens Somatom 64 slice scanner. Axial
non-contrast 3 mm slices were carried out through the heart. The
data set was analyzed on a dedicated work station and scored using
the Agatson method.

[Series 2: casc 3.0 i36f 2 bestdiast 69 % · axial · 0.31mm/px · z∈[-240,-123]mm · 8 of 51 slices shown, 10 images]
[im 6/51  vessel]
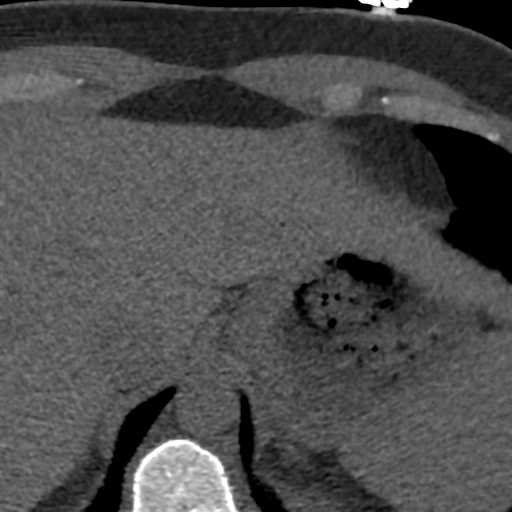
[im 6/51  lung]
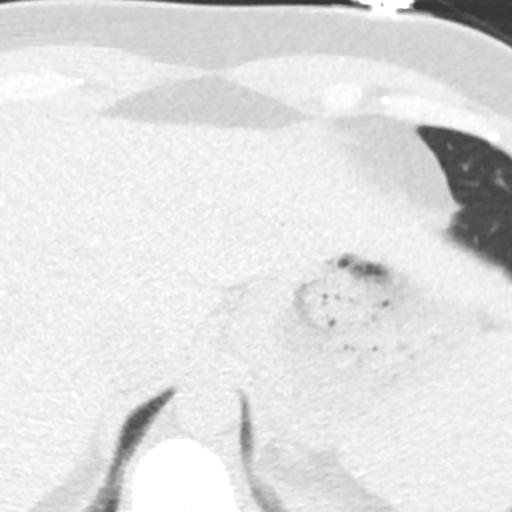
[im 12/51  vessel]
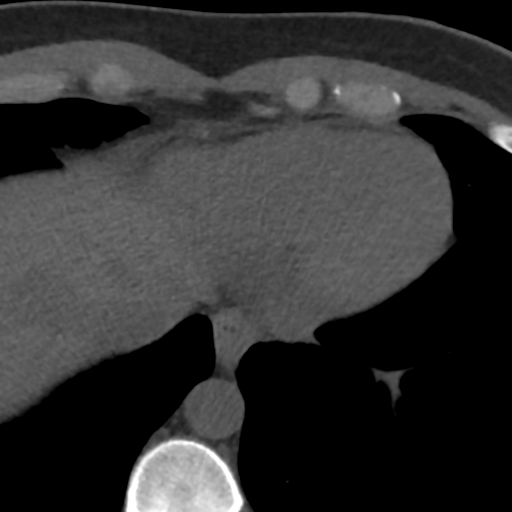
[im 17/51  vessel]
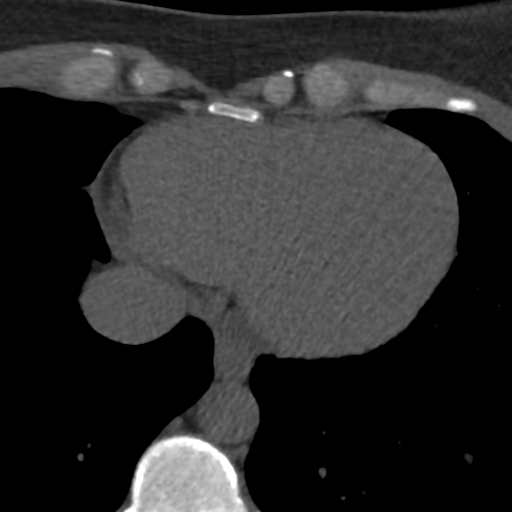
[im 23/51  vessel]
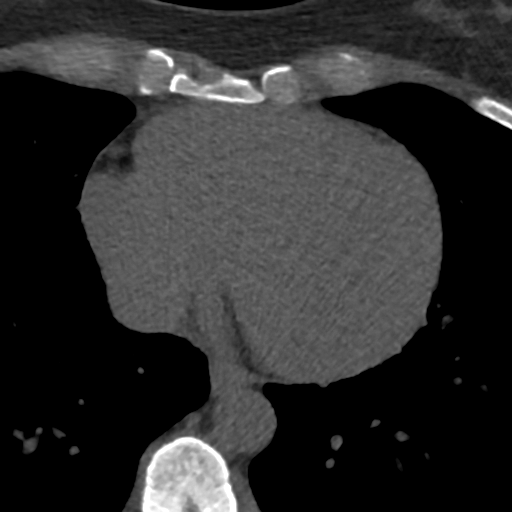
[im 28/51  vessel]
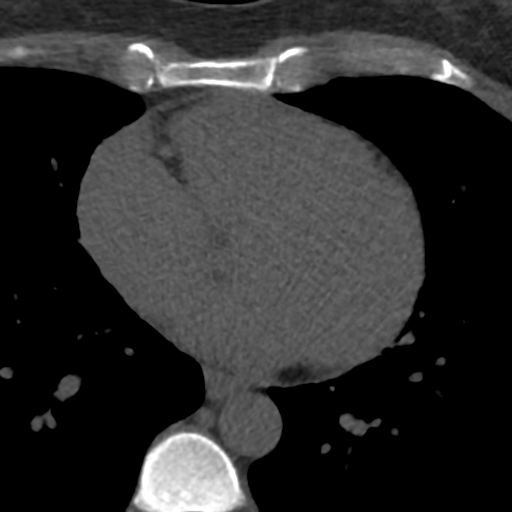
[im 28/51  lung]
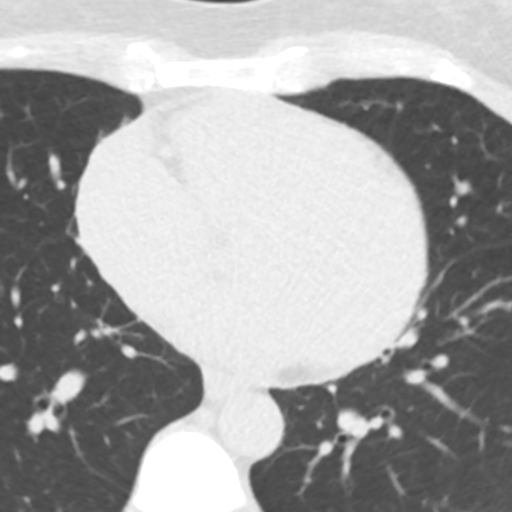
[im 34/51  vessel]
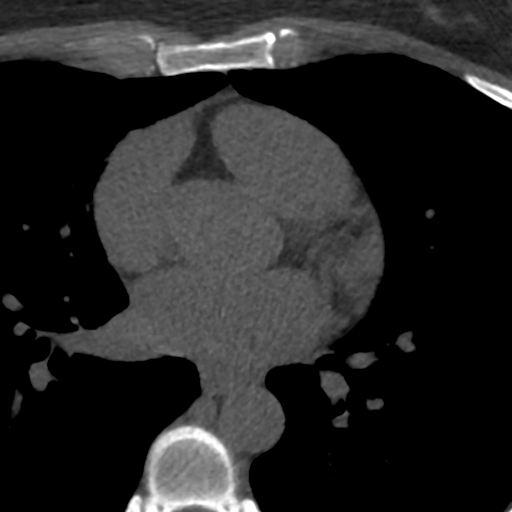
[im 39/51  vessel]
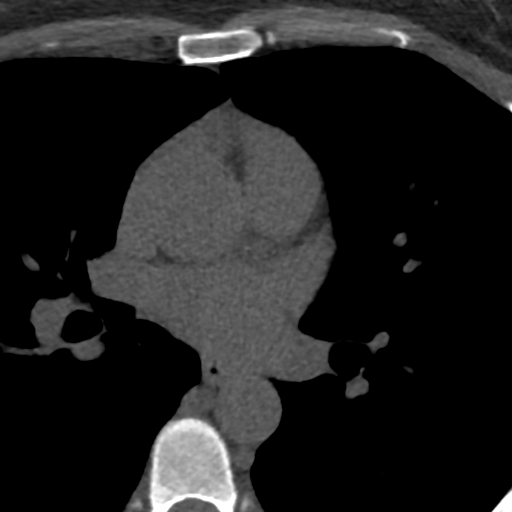
[im 45/51  vessel]
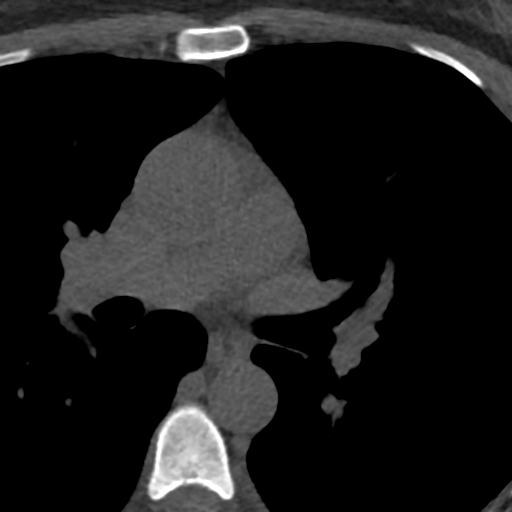

[Series 4: lung st 69 % · axial · 0.60mm/px · z∈[-240,-122]mm · 8 of 51 slices shown]
[im 6/51  lung]
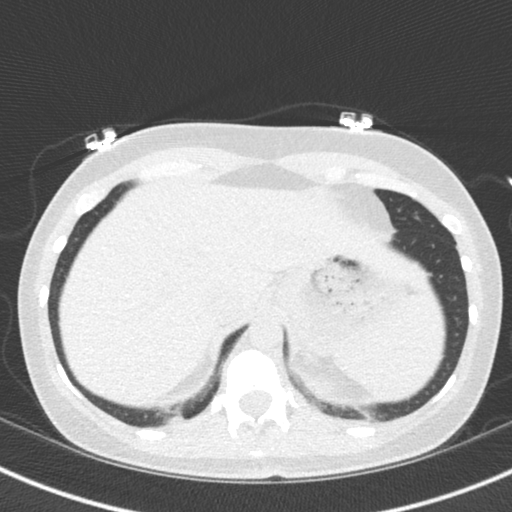
[im 12/51  lung]
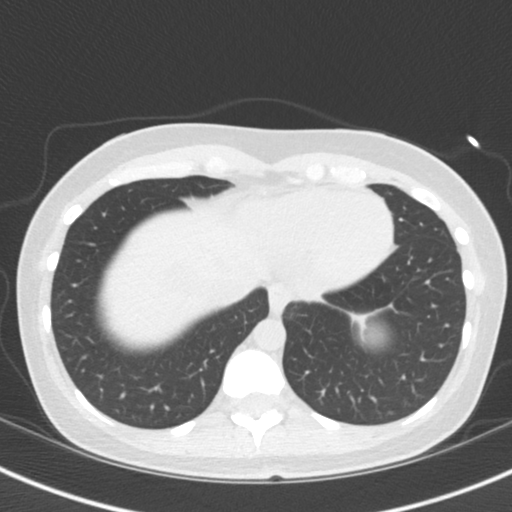
[im 17/51  lung]
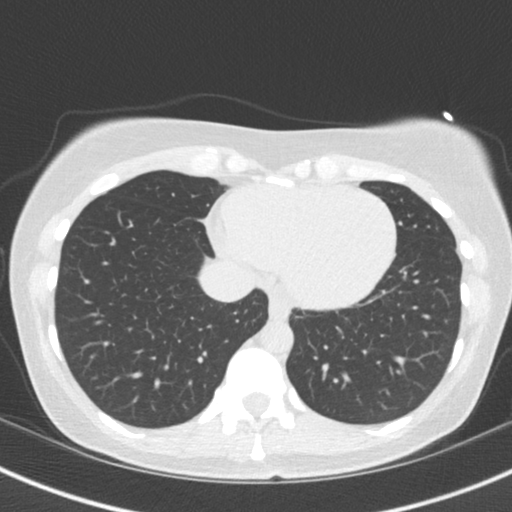
[im 23/51  lung]
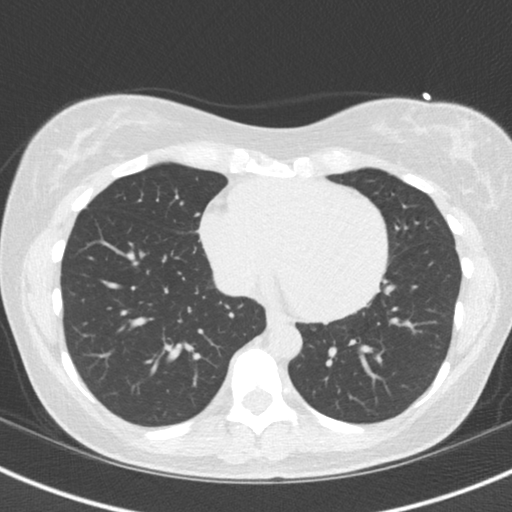
[im 28/51  lung]
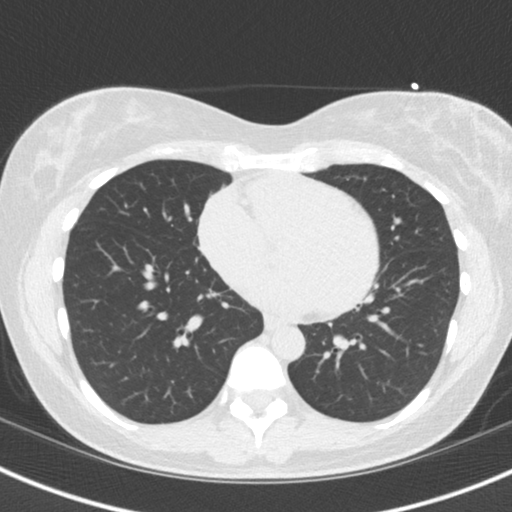
[im 34/51  lung]
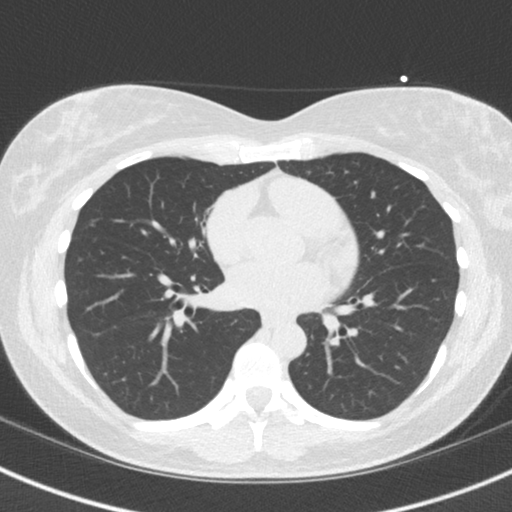
[im 39/51  lung]
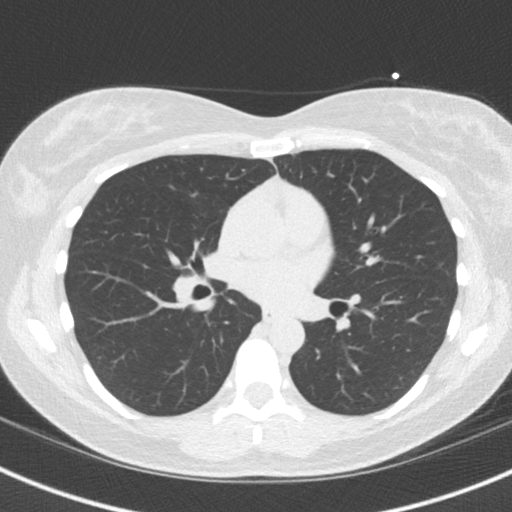
[im 45/51  lung]
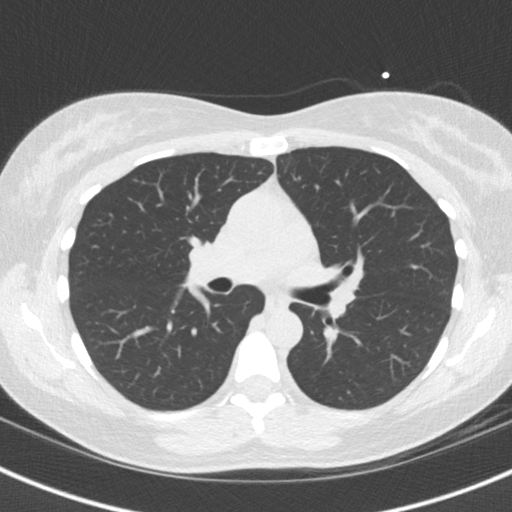

[16 of 20 positions shown; findings below may reference images not displayed]

FINDINGS: Vascular: No significant vascular findings. Normal heart size. No
pericardial effusion.

Mediastinum/Nodes: No enlarged mediastinal or axillary lymph nodes.
Thyroid gland, trachea, and esophagus demonstrate no significant
findings.

Lungs/Pleura: Lungs are clear. No pleural effusion or pneumothorax.

Upper Abdomen: No acute abnormality.

Musculoskeletal: No chest wall mass or suspicious bone lesions
identified.
IMPRESSION: Negative over-read.
FINDINGS: Non-cardiac: See separate report from [REDACTED].

Ascending aorta: Normal diameter 3.1 cm

Pericardium: Normal

Coronary arteries: No calcium detected
IMPRESSION: Coronary calcium score of 0.

Ronna Shabazz

*** End of Addendum ***

## 2020-09-25 ENCOUNTER — Telehealth: Payer: BC Managed Care – PPO | Admitting: Internal Medicine

## 2020-10-07 ENCOUNTER — Encounter: Payer: Self-pay | Admitting: Internal Medicine

## 2020-10-10 ENCOUNTER — Telehealth: Payer: Self-pay

## 2020-10-10 NOTE — Progress Notes (Deleted)
Office Visit Note  Patient: [name]             Date of Birth: 03/27/1976           MRN: [id number]             PCP: Madelin Headings, MD Referring: Madelin Headings, MD Visit Date: 10/24/2020 Occupation: @GUAROCC @  Subjective:  No chief complaint on file.   History of Present Illness: [name] is a 45 y.o. female ***   Activities of Daily Living:  Patient reports morning stiffness for *** {minute/hour:19697}.   Patient {ACTIONS;DENIES/REPORTS:21021675::"Denies"} nocturnal pain.  Difficulty dressing/grooming: {ACTIONS;DENIES/REPORTS:21021675::"Denies"} Difficulty climbing stairs: {ACTIONS;DENIES/REPORTS:21021675::"Denies"} Difficulty getting out of chair: {ACTIONS;DENIES/REPORTS:21021675::"Denies"} Difficulty using hands for taps, buttons, cutlery, and/or writing: {ACTIONS;DENIES/REPORTS:21021675::"Denies"}  No Rheumatology ROS completed.   PMFS History:  Patient Active Problem List   Diagnosis Date Noted  . Ureteral stone 10/02/2018  . DDD (degenerative disc disease), lumbar 10/02/2018  . History of migraine 10/02/2018  . Family history of heart disease 07/17/2018  . Intermittent FUO 06/26/2018  . Wrist joint pain 05/10/2018  . Recurrent headache 11/24/2014  . Elevated BP 11/24/2014  . Nasal obstruction 06/28/2014    Past Medical History:  Diagnosis Date  . Borderline hypertension   . Endometriosis    surgery 2009  . Frequency of urination   . Hx of varicella   . Left ureteral stone   . Migraine   . PONV (postoperative nausea and vomiting)   . Urgency of urination     Family History  Problem Relation Age of Onset  . Hypertension Mother   . Breast cancer Mother        age 3   . Heart failure Mother        after breast cancer rx has icdalso   . Hypertension Father   . Healthy Son   . Healthy Son    Past Surgical History:  Procedure Laterality Date  . COLONOSCOPY  10/11/2018  . CYSTOSCOPY/RETROGRADE/URETEROSCOPY/STONE EXTRACTION  WITH BASKET Left 02/13/2016   Procedure: CYSTOSCOPY/RETROGRADE/URETEROSCOPY/STONE EXTRACTION WITH BASKET;  Surgeon: 04/15/2016, MD;  Location: [facility];  Service: Urology;  Laterality: Left;  . LAPAROSCOPY LYSIS ADHESIONS/  ABLATION AND BX'S OF ENDOMETRIAL LESIONS/  RIGHT FIMBROPLASTY  10-02-2007  . NASAL SEPTUM SURGERY  02/ 2016  . TONSILLECTOMY  1985   Social History   Social History Narrative   6-8 hours of sleep per night   Does not work outside home   Married for 12 years with 2 children   Children are 80yrs and 5 yrs.   No pets   etoh social    Hot yoga  And walking    g2 p2   Born raised  [place] 4yr degree univ [employer]   Immunization History  Administered Date(s) Administered  . Influenza,inj,Quad PF,6+ Mos 05/27/2018, 04/28/2019, 06/17/2020  . Influenza-Unspecified 08/09/2016, 05/09/2018, 05/10/2019  . Janssen (J&J) SARS-COV-2 Vaccination 10/21/2019, 07/15/2020  . Tdap 11/20/2014     Objective: Vital Signs: There were no vitals taken for this visit.   Physical Exam   Musculoskeletal Exam: ***  CDAI Exam: CDAI Score: -- Patient Global: --; Provider Global: -- Swollen: --; Tender: -- Joint Exam 10/24/2020   No joint exam has been documented for this visit   There is currently no information documented on the homunculus. Go to the Rheumatology activity and complete the homunculus joint exam.  Investigation: No additional findings.  Imaging: No results  found.  Recent Labs: Lab Results  Component Value Date   WBC 5.5 12/31/2019   HGB 13.9 12/31/2019   PLT 201.0 12/31/2019   NA 139 12/31/2019   K 4.0 12/31/2019   CL 104 12/31/2019   CO2 26 12/31/2019   GLUCOSE 76 12/31/2019   BUN 21 12/31/2019   CREATININE 0.90 12/31/2019   BILITOT 0.9 12/31/2019   ALKPHOS 65 12/31/2019   AST 20 12/31/2019   ALT 18 12/31/2019   PROT 7.2 12/31/2019   ALBUMIN 4.6 12/31/2019   CALCIUM 9.8 12/31/2019    GFRAA >60 02/11/2015    Speciality Comments: No specialty comments available.  Procedures:  No procedures performed Allergies: Patient has no known allergies.   Assessment / Plan:     Visit Diagnoses: No diagnosis found.  Orders: No orders of the defined types were placed in this encounter.  No orders of the defined types were placed in this encounter.   Face-to-face time spent with patient was *** minutes. Greater than 50% of time was spent in counseling and coordination of care.  Follow-Up Instructions: No follow-ups on file.   Ellen Henri, CMA  Note - This record has been created using Animal nutritionist.  Chart creation errors have been sought, but may not always  have been located. Such creation errors do not reflect on  the standard of medical care.

## 2020-10-10 NOTE — Telephone Encounter (Signed)
Attempted to contact patient and left message on machine to advise patient to call the office and let us know if she has had AVISE labs repeated. Advised patient we may need to reschedule the appointment if she has not had the AVISE labs drawn. patient is currently scheduled for 10/24/2020.

## 2020-10-24 ENCOUNTER — Ambulatory Visit: Payer: BC Managed Care – PPO | Admitting: Rheumatology

## 2020-10-24 DIAGNOSIS — M2550 Pain in unspecified joint: Secondary | ICD-10-CM

## 2020-10-24 DIAGNOSIS — R768 Other specified abnormal immunological findings in serum: Secondary | ICD-10-CM

## 2020-10-24 DIAGNOSIS — Z8249 Family history of ischemic heart disease and other diseases of the circulatory system: Secondary | ICD-10-CM

## 2020-10-24 DIAGNOSIS — M5136 Other intervertebral disc degeneration, lumbar region: Secondary | ICD-10-CM

## 2020-10-24 DIAGNOSIS — R030 Elevated blood-pressure reading, without diagnosis of hypertension: Secondary | ICD-10-CM

## 2020-10-24 DIAGNOSIS — Z8669 Personal history of other diseases of the nervous system and sense organs: Secondary | ICD-10-CM

## 2020-10-24 DIAGNOSIS — N201 Calculus of ureter: Secondary | ICD-10-CM

## 2020-11-25 DIAGNOSIS — G43909 Migraine, unspecified, not intractable, without status migrainosus: Secondary | ICD-10-CM | POA: Diagnosis not present

## 2020-11-25 DIAGNOSIS — N809 Endometriosis, unspecified: Secondary | ICD-10-CM | POA: Diagnosis not present

## 2020-11-25 DIAGNOSIS — E349 Endocrine disorder, unspecified: Secondary | ICD-10-CM | POA: Diagnosis not present

## 2020-12-09 DIAGNOSIS — Z1321 Encounter for screening for nutritional disorder: Secondary | ICD-10-CM | POA: Diagnosis not present

## 2020-12-09 DIAGNOSIS — G43909 Migraine, unspecified, not intractable, without status migrainosus: Secondary | ICD-10-CM | POA: Diagnosis not present

## 2020-12-09 DIAGNOSIS — E349 Endocrine disorder, unspecified: Secondary | ICD-10-CM | POA: Diagnosis not present

## 2020-12-09 DIAGNOSIS — N809 Endometriosis, unspecified: Secondary | ICD-10-CM | POA: Diagnosis not present

## 2020-12-11 DIAGNOSIS — N83202 Unspecified ovarian cyst, left side: Secondary | ICD-10-CM | POA: Diagnosis not present

## 2020-12-11 DIAGNOSIS — Z6822 Body mass index (BMI) 22.0-22.9, adult: Secondary | ICD-10-CM | POA: Diagnosis not present

## 2020-12-11 DIAGNOSIS — N939 Abnormal uterine and vaginal bleeding, unspecified: Secondary | ICD-10-CM | POA: Diagnosis not present

## 2020-12-26 DIAGNOSIS — N809 Endometriosis, unspecified: Secondary | ICD-10-CM | POA: Diagnosis not present

## 2020-12-26 DIAGNOSIS — E349 Endocrine disorder, unspecified: Secondary | ICD-10-CM | POA: Diagnosis not present

## 2020-12-26 DIAGNOSIS — Z1321 Encounter for screening for nutritional disorder: Secondary | ICD-10-CM | POA: Diagnosis not present

## 2020-12-26 DIAGNOSIS — G43909 Migraine, unspecified, not intractable, without status migrainosus: Secondary | ICD-10-CM | POA: Diagnosis not present

## 2021-01-02 ENCOUNTER — Encounter: Payer: BC Managed Care – PPO | Admitting: Internal Medicine

## 2021-01-05 NOTE — Progress Notes (Signed)
Chief Complaint  Patient presents with  . Annual Exam    HPI: Patient  [name]  45 y.o. comes in today for Preventive Health Care visit   Had Mirena iud taken out herself.  Since last visit had been in   10th year.  Feels much better feels that it was adding to her symptoms So saw  Doc in [place].  Some homeopathic and hormone levels.  Doing supplements. Maybe feeling better   Taking  Labs 21 days after cycle  Hormone levels.  higfh estrogen lower progesterone   Test lower and given estrogen control  Supplement  Had breast tenderness.  No period ok.  Sleep  vitex  Working on .  HA  Better tends to be around period .     Declining bioidentical hormones or hormones at this time. She does have a copy of the lab reports that were done Is up-to-date on colonoscopy. Household of 4  bp  In am  About 130 high 80s  And low 90s   Healthy eating is turned to vegan feels much better with this.  Health Maintenance  Topic Date Due  . Hepatitis C Screening  Never done  . INFLUENZA VACCINE  03/09/2021  . PAP SMEAR-Modifier  03/13/2023  . TETANUS/TDAP  11/19/2024  . Zoster Vaccines- Shingrix (1 of 2) 10/16/2025  . COLONOSCOPY (Pts [id number] Insurance coverage will need to be confirmed)  10/10/2028  . COVID-19 Vaccine  Completed  . HIV Screening  Completed  . HPV VACCINES  Aged Out   Health Maintenance Review LIFESTYLE:  Exercise: Yes Tobacco/ETS: No Now eating vegan and low gluten Sugar beverages: No Sleep: Some interruptions but okay back to sleep Drug use: no HH of 4   ROS:  GEN/ HEENT: No fever, significant weight changes sweats headaches vision problems hearing changes, CV/ PULM; No chest pain shortness of breath cough, syncope,edema  change in exercise tolerance. GI /GU: No adominal pain, vomiting, change in bowel habits. No blood in the stool. No significant GU symptoms.  Periods about every 21 days of her IUD. SKIN/HEME: ,no acute skin rashes suspicious lesions or  bleeding. No lymphadenopathy, nodules, masses.  NEURO/ PSYCH:  No neurologic signs such as weakness numbness. No depression anxiety. IMM/ Allergy: No unusual infections.  Allergy .   REST of 12 system review negative except as per HPI   Past Medical History:  Diagnosis Date  . Borderline hypertension   . Endometriosis    surgery 2009  . Frequency of urination   . Hx of varicella   . Left ureteral stone   . Migraine   . PONV (postoperative nausea and vomiting)   . Urgency of urination     Past Surgical History:  Procedure Laterality Date  . COLONOSCOPY  10/11/2018  . CYSTOSCOPY/RETROGRADE/URETEROSCOPY/STONE EXTRACTION WITH BASKET Left 02/13/2016   Procedure: CYSTOSCOPY/RETROGRADE/URETEROSCOPY/STONE EXTRACTION WITH BASKET;  Surgeon: Marcine MatarStephen Dahlstedt, MD;  [facility] [place];  Service: Urology;  Laterality: Left;  . LAPAROSCOPY LYSIS ADHESIONS/  ABLATION AND BX'S OF ENDOMETRIAL LESIONS/  RIGHT FIMBROPLASTY  10-02-2007  . NASAL SEPTUM SURGERY  02/ 2016  . TONSILLECTOMY  1985    Family History  Problem Relation Age of Onset  . Hypertension Mother   . Breast cancer Mother        age 45   . Heart failure Mother        after breast cancer rx has icdalso   . Hypertension Father   . Healthy Son   .  Healthy Son     Social History   Socioeconomic History  . Marital status: Married    Spouse name: Not on file  . Number of children: Not on file  . Years of education: Not on file  . Highest education level: Not on file  Occupational History  . Not on file  Tobacco Use  . Smoking status: Never Smoker  . Smokeless tobacco: Never Used  Vaping Use  . Vaping Use: Never used  Substance and Sexual Activity  . Alcohol use: Yes    Alcohol/week: 0.0 standard drinks    Comment: social  . Drug use: No  . Sexual activity: Yes    Partners: Male    Birth control/protection: I.U.D.    Comment: Mirena IUD placed 2016 approx  Other Topics Concern  . Not on file   Social History Narrative   6-8 hours of sleep per night   Does not work outside home   Married for 12 years with 2 children   Children are 51yrs and 5 yrs.   No pets   etoh social    Hot yoga  And walking    g2 p2   Born raised  [place] [employer] degree [employer] [place]   Social Determinants of Health   Financial Resource Strain: Not on file  Food Insecurity: Not on file  Transportation Needs: Not on file  Physical Activity: Not on file  Stress: Not on file  Social Connections: Not on file    Outpatient Medications Prior to Visit  Medication Sig Dispense Refill  . Biotin 5000 MCG CAPS Take 1 capsule by mouth daily.     Gwenyth Ober Tree (VITEX EXTRACT PO) Take by mouth.    . Collagen Hydrolysate POWD 1 Scoop daily    . Diclofenac Potassium (ZIPSOR) 25 MG CAPS Take 1 every 8 hours as needed for migraine headache. 30 capsule 2  . ESTROGENS CONJUGATED PO Take by mouth.    . hydrOXYzine (VISTARIL) 25 MG capsule hydroxyzine pamoate 25 mg capsule    . Magnesium 400 MG CAPS Take 1 tablet by mouth daily.    . metoprolol succinate (TOPROL-XL) 25 MG 24 hr tablet TAKE 1/2 TABLET BY MOUTH EVERY DAY 30 tablet 0  . metroNIDAZOLE (METROCREAM) 0.75 % cream as needed.    . Multiple Vitamins-Minerals (HAIR VITAMINS) TABS Take 1 tablet by mouth daily.    . Naproxen Sodium 220 MG CAPS Take by mouth as needed.    . Probiotic Product (PROBIOTIC DAILY) CAPS Take 1 capsule by mouth daily.    Marland Kitchen tretinoin (RETIN-A) 0.05 % cream tretinoin 0.05 % topical cream  APPLY TO AFFECTED AREA AS NEEDED    . ZOLMitriptan (ZOMIG) 2.5 MG tablet Take 1 tablet (2.5 mg total) by mouth as needed for migraine. 10 tablet 2   No facility-administered medications prior to visit.     EXAM:  BP (!) 144/80 (BP Location: Left Arm, Patient Position: Sitting, Cuff Size: Normal)   Pulse 76   Temp 98.5 F (36.9 C) (Oral)   Ht 5' 3.5" (1.613 m)   Wt 131 lb 12.8 oz (59.8 kg)   SpO2 99%   BMI 22.98  kg/m   Body mass index is 22.98 kg/m. Wt Readings from Last 3 Encounters:  01/06/21 131 lb 12.8 oz (59.8 kg)  07/23/20 131 lb 6.4 oz (59.6 kg)  06/25/20 133 lb 3.2 oz (60.4 kg)    Physical Exam: Vital signs reviewed ZOX:WRUE is a  well-developed well-nourished alert cooperative    who appearsr stated age in no acute distress.  HEENT: normocephalic atraumatic , Eyes: PERRL EOM's full, conjunctiva clear, Nares: paten,t no deformity discharge or tenderness., Ears: no deformity EAC's clear TMs with normal landmarks. Mouth: clear OP, masked .  NECK: supple without masses, thyromegaly or bruits. CHEST/PULM:  Clear to auscultation and percussion breath sounds equal no wheeze , rales or rhonchi. No chest wall deformities or tenderness. Breast: normal by inspection . No dimpling, discharge, masses, tenderness or discharge . CV: PMI is nondisplaced, S1 S2 no gallops, murmurs, rubs. Peripheral pulses are full without delay.No JVD .  ABDOMEN: Bowel sounds normal nontender  No guard or rebound, no hepato splenomegal no CVA tenderness.  No hernia. Extremtities:  No clubbing cyanosis or edema, no acute joint swelling or redness no focal atrophy NEURO:  Oriented x3, cranial nerves 3-12 appear to be intact, no obvious focal weakness,gait within normal limits no abnormal reflexes or asymmetrical SKIN: No acute rashes normal turgor, color, no bruising or petechiae. PSYCH: Oriented, good eye contact, no obvious depression anxiety, cognition and judgment appear normal. LN: no cervical axillary inguinal adenopathy Lab review shows normal CBC thyroid hormone levels reviewed FSH in normal range Lab Results  Component Value Date   WBC 5.5 12/31/2019   HGB 13.9 12/31/2019   HCT 40.4 12/31/2019   PLT 201.0 12/31/2019   GLUCOSE 76 12/31/2019   CHOL 173 12/31/2019   TRIG 75.0 12/31/2019   HDL 52.20 12/31/2019   LDLCALC 106 (H) 12/31/2019   ALT 18 12/31/2019   AST 20 12/31/2019   NA 139 12/31/2019   K 4.0  12/31/2019   CL 104 12/31/2019   CREATININE 0.90 12/31/2019   BUN 21 12/31/2019   CO2 26 12/31/2019   TSH 1.55 12/31/2019   HGBA1C 4.9 12/31/2019    BP Readings from Last 3 Encounters:  01/06/21 (!) 144/80  07/23/20 130/90  06/25/20 (!) 160/88    Lab plan reviewed with patient as well as review of her labs from [facility].  Normal CBC thyroid B12 was high greater than 2000 vitamin D 52 gesturing 3.3 testosterone 1.2 estrogen 289 and estradiol FSH 5.9 Hep c lipid panel fasting. ASSESSMENT AND PLAN:  Discussed the following assessment and plan:    ICD-10-CM   1. Visit for preventive health examination  Z00.00 Basic metabolic panel    Hepatic function panel    Lipid panel    C-reactive protein  2. History of migraine  Z86.69 Basic metabolic panel    Hepatic function panel    Lipid panel  3. Perimenopause symptoms  N95.1 Basic metabolic panel    Hepatic function panel    Lipid panel    C-reactive protein   early   4. History of elevated antinuclear antibody (ANA)  [id number] Basic metabolic panel    Hepatic function panel    Lipid panel    C-reactive protein  5. Need for hepatitis C screening test  Z11.59 Hepatitis C antibody  6. Elevated blood pressure reading in office without diagnosis of hypertension  R03.0   Helped much better after taking out IUD still has some perimenopausal symptoms and is aware. Continue to monitor blood pressure her diastolics tend to run a bit high but at this time would not add medicati reviewed health care maintenance plan fasting labs.  Return in about 1 year (around 01/06/2022) for preventive /cpx.  Patient Care Team: Ramy Greth, Neta Mends, MD as PCP - General (Internal Medicine) Waynard Reeds,  MD as Consulting Physician (Obstetrics and Gynecology) Tora Duck, PA-C (Physician Assistant) Patient Instructions   Glad you are doing better .  Get appt for fasting labs .  Check bo readings a few days every few months . Continue lifestyle  intervention healthy eating and exercise .    Health Maintenance, Female Adopting a healthy lifestyle and getting preventive care are important in promoting health and wellness. Ask your health care provider about:  The right schedule for you to have regular tests and exams.  Things you can do on your own to prevent diseases and keep yourself healthy. What should I know about diet, weight, and exercise? Eat a healthy diet  Eat a diet that includes plenty of vegetables, fruits, low-fat dairy products, and lean protein.  Do not eat a lot of foods that are high in solid fats, added sugars, or sodium.   Maintain a healthy weight Body mass index (BMI) is used to identify weight problems. It estimates body fat based on height and weight. Your health care provider can help determine your BMI and help you achieve or maintain a healthy weight. Get regular exercise Get regular exercise. This is one of the most important things you can do for your health. Most adults should:  Exercise for at least 150 minutes each week. The exercise should increase your heart rate and make you sweat (moderate-intensity exercise).  Do strengthening exercises at least twice a week. This is in addition to the moderate-intensity exercise.  Spend less time sitting. Even light physical activity can be beneficial. Watch cholesterol and blood lipids Have your blood tested for lipids and cholesterol at 45 years of age, then have this test every 5 years. Have your cholesterol levels checked more often if:  Your lipid or cholesterol levels are high.  You are older than 45 years of age.  You are at high risk for heart disease. What should I know about cancer screening? Depending on your health history and family history, you may need to have cancer screening at various ages. This may include screening for:  Breast cancer.  Cervical cancer.  Colorectal cancer.  Skin cancer.  Lung cancer. What should I know  about heart disease, diabetes, and high blood pressure? Blood pressure and heart disease  High blood pressure causes heart disease and increases the risk of stroke. This is more likely to develop in people who have high blood pressure readings, are of African descent, or are overweight.  Have your blood pressure checked: ? Every 3-5 years if you are 54-38 years of age. ? Every year if you are 34 years old or older. Diabetes Have regular diabetes screenings. This checks your fasting blood sugar level. Have the screening done:  Once every three years after age 71 if you are at a normal weight and have a low risk for diabetes.  More often and at a younger age if you are overweight or have a high risk for diabetes. What should I know about preventing infection? Hepatitis B If you have a higher risk for hepatitis B, you should be screened for this virus. Talk with your health care provider to find out if you are at risk for hepatitis B infection. Hepatitis C Testing is recommended for:  Everyone born from [birth year] through 1965.  Anyone with known risk factors for hepatitis C. Sexually transmitted infections (STIs)  Get screened for STIs, including gonorrhea and chlamydia, if: ? You are sexually active and are younger than 45 years of  age. ? You are older than 45 years of age and your health care provider tells you that you are at risk for this type of infection. ? Your sexual activity has changed since you were last screened, and you are at increased risk for chlamydia or gonorrhea. Ask your health care provider if you are at risk.  Ask your health care provider about whether you are at high risk for HIV. Your health care provider may recommend a prescription medicine to help prevent HIV infection. If you choose to take medicine to prevent HIV, you should first get tested for HIV. You should then be tested every 3 months for as long as you are taking the medicine. Pregnancy  If you are about  to stop having your period (premenopausal) and you may become pregnant, seek counseling before you get pregnant.  Take 400 to 800 micrograms (mcg) of folic acid every day if you become pregnant.  Ask for birth control (contraception) if you want to prevent pregnancy. Osteoporosis and menopause Osteoporosis is a disease in which the bones lose minerals and strength with aging. This can result in bone fractures. If you are 70 years old or older, or if you are at risk for osteoporosis and fractures, ask your health care provider if you should:  Be screened for bone loss.  Take a calcium or vitamin D supplement to lower your risk of fractures.  Be given hormone replacement therapy (HRT) to treat symptoms of menopause. Follow these instructions at home: Lifestyle  Do not use any products that contain nicotine or tobacco, such as cigarettes, e-cigarettes, and chewing tobacco. If you need help quitting, ask your health care provider.  Do not use street drugs.  Do not share needles.  Ask your health care provider for help if you need support or information about quitting drugs. Alcohol use  Do not drink alcohol if: ? Your health care provider tells you not to drink. ? You are pregnant, may be pregnant, or are planning to become pregnant.  If you drink alcohol: ? Limit how much you use to 0-1 drink a day. ? Limit intake if you are breastfeeding.  Be aware of how much alcohol is in your drink. In the [place] one drink equals one 12 oz bottle of beer (355 mL), one 5 oz glass of wine (148 mL), or one 1 oz glass of hard liquor (44 mL). General instructions  Schedule regular health, dental, and eye exams.  Stay current with your vaccines.  Tell your health care provider if: ? You often feel depressed. ? You have ever been abused or do not feel safe at home. Summary  Adopting a healthy lifestyle and getting preventive care are important in promoting health and wellness.  Follow your  health care provider's instructions about healthy diet, exercising, and getting tested or screened for diseases.  Follow your health care provider's instructions on monitoring your cholesterol and blood pressure. This information is not intended to replace advice given to you by your health care provider. Make sure you discuss any questions you have with your health care provider. Document Revised: 07/19/2018 Document Reviewed: 07/19/2018 Elsevier Patient Education  2021 [employer].    [place]. Melanee Cordial M.D.

## 2021-01-06 ENCOUNTER — Encounter: Payer: Self-pay | Admitting: Internal Medicine

## 2021-01-06 ENCOUNTER — Ambulatory Visit (INDEPENDENT_AMBULATORY_CARE_PROVIDER_SITE_OTHER): Payer: BC Managed Care – PPO | Admitting: Internal Medicine

## 2021-01-06 ENCOUNTER — Other Ambulatory Visit: Payer: Self-pay

## 2021-01-06 VITALS — BP 144/80 | HR 76 | Temp 98.5°F | Ht 63.5 in | Wt 131.8 lb

## 2021-01-06 DIAGNOSIS — Z87898 Personal history of other specified conditions: Secondary | ICD-10-CM | POA: Diagnosis not present

## 2021-01-06 DIAGNOSIS — N951 Menopausal and female climacteric states: Secondary | ICD-10-CM

## 2021-01-06 DIAGNOSIS — Z1159 Encounter for screening for other viral diseases: Secondary | ICD-10-CM

## 2021-01-06 DIAGNOSIS — Z0000 Encounter for general adult medical examination without abnormal findings: Secondary | ICD-10-CM | POA: Diagnosis not present

## 2021-01-06 DIAGNOSIS — R030 Elevated blood-pressure reading, without diagnosis of hypertension: Secondary | ICD-10-CM

## 2021-01-06 DIAGNOSIS — Z8669 Personal history of other diseases of the nervous system and sense organs: Secondary | ICD-10-CM

## 2021-01-06 DIAGNOSIS — Z79899 Other long term (current) drug therapy: Secondary | ICD-10-CM

## 2021-01-06 NOTE — Patient Instructions (Signed)
Glad you are doing better .  Get appt for fasting labs .  Check bo readings a few days every few months . Continue lifestyle intervention healthy eating and exercise .    Health Maintenance, Female Adopting a healthy lifestyle and getting preventive care are important in promoting health and wellness. Ask your health care provider about:  The right schedule for you to have regular tests and exams.  Things you can do on your own to prevent diseases and keep yourself healthy. What should I know about diet, weight, and exercise? Eat a healthy diet  Eat a diet that includes plenty of vegetables, fruits, low-fat dairy products, and lean protein.  Do not eat a lot of foods that are high in solid fats, added sugars, or sodium.   Maintain a healthy weight Body mass index (BMI) is used to identify weight problems. It estimates body fat based on height and weight. Your health care provider can help determine your BMI and help you achieve or maintain a healthy weight. Get regular exercise Get regular exercise. This is one of the most important things you can do for your health. Most adults should:  Exercise for at least 150 minutes each week. The exercise should increase your heart rate and make you sweat (moderate-intensity exercise).  Do strengthening exercises at least twice a week. This is in addition to the moderate-intensity exercise.  Spend less time sitting. Even light physical activity can be beneficial. Watch cholesterol and blood lipids Have your blood tested for lipids and cholesterol at 45 years of age, then have this test every 5 years. Have your cholesterol levels checked more often if:  Your lipid or cholesterol levels are high.  You are older than 45 years of age.  You are at high risk for heart disease. What should I know about cancer screening? Depending on your health history and family history, you may need to have cancer screening at various ages. This may include  screening for:  Breast cancer.  Cervical cancer.  Colorectal cancer.  Skin cancer.  Lung cancer. What should I know about heart disease, diabetes, and high blood pressure? Blood pressure and heart disease  High blood pressure causes heart disease and increases the risk of stroke. This is more likely to develop in people who have high blood pressure readings, are of African descent, or are overweight.  Have your blood pressure checked: ? Every 3-5 years if you are 34-89 years of age. ? Every year if you are 72 years old or older. Diabetes Have regular diabetes screenings. This checks your fasting blood sugar level. Have the screening done:  Once every three years after age 35 if you are at a normal weight and have a low risk for diabetes.  More often and at a younger age if you are overweight or have a high risk for diabetes. What should I know about preventing infection? Hepatitis B If you have a higher risk for hepatitis B, you should be screened for this virus. Talk with your health care provider to find out if you are at risk for hepatitis B infection. Hepatitis C Testing is recommended for:  Everyone born from [birth year] through 1965.  Anyone with known risk factors for hepatitis C. Sexually transmitted infections (STIs)  Get screened for STIs, including gonorrhea and chlamydia, if: ? You are sexually active and are younger than 45 years of age. ? You are older than 45 years of age and your health care provider tells you that  you are at risk for this type of infection. ? Your sexual activity has changed since you were last screened, and you are at increased risk for chlamydia or gonorrhea. Ask your health care provider if you are at risk.  Ask your health care provider about whether you are at high risk for HIV. Your health care provider may recommend a prescription medicine to help prevent HIV infection. If you choose to take medicine to prevent HIV, you should first get  tested for HIV. You should then be tested every 3 months for as long as you are taking the medicine. Pregnancy  If you are about to stop having your period (premenopausal) and you may become pregnant, seek counseling before you get pregnant.  Take 400 to 800 micrograms (mcg) of folic acid every day if you become pregnant.  Ask for birth control (contraception) if you want to prevent pregnancy. Osteoporosis and menopause Osteoporosis is a disease in which the bones lose minerals and strength with aging. This can result in bone fractures. If you are 24 years old or older, or if you are at risk for osteoporosis and fractures, ask your health care provider if you should:  Be screened for bone loss.  Take a calcium or vitamin D supplement to lower your risk of fractures.  Be given hormone replacement therapy (HRT) to treat symptoms of menopause. Follow these instructions at home: Lifestyle  Do not use any products that contain nicotine or tobacco, such as cigarettes, e-cigarettes, and chewing tobacco. If you need help quitting, ask your health care provider.  Do not use street drugs.  Do not share needles.  Ask your health care provider for help if you need support or information about quitting drugs. Alcohol use  Do not drink alcohol if: ? Your health care provider tells you not to drink. ? You are pregnant, may be pregnant, or are planning to become pregnant.  If you drink alcohol: ? Limit how much you use to 0-1 drink a day. ? Limit intake if you are breastfeeding.  Be aware of how much alcohol is in your drink. In the [place] one drink equals one 12 oz bottle of beer (355 mL), one 5 oz glass of wine (148 mL), or one 1 oz glass of hard liquor (44 mL). General instructions  Schedule regular health, dental, and eye exams.  Stay current with your vaccines.  Tell your health care provider if: ? You often feel depressed. ? You have ever been abused or do not feel safe at  home. Summary  Adopting a healthy lifestyle and getting preventive care are important in promoting health and wellness.  Follow your health care provider's instructions about healthy diet, exercising, and getting tested or screened for diseases.  Follow your health care provider's instructions on monitoring your cholesterol and blood pressure. This information is not intended to replace advice given to you by your health care provider. Make sure you discuss any questions you have with your health care provider. Document Revised: 07/19/2018 Document Reviewed: 07/19/2018 Elsevier Patient Education  2021 [employer].

## 2021-01-12 ENCOUNTER — Other Ambulatory Visit: Payer: Self-pay

## 2021-01-12 ENCOUNTER — Other Ambulatory Visit (INDEPENDENT_AMBULATORY_CARE_PROVIDER_SITE_OTHER): Payer: BC Managed Care – PPO

## 2021-01-12 DIAGNOSIS — Z1159 Encounter for screening for other viral diseases: Secondary | ICD-10-CM

## 2021-01-12 DIAGNOSIS — Z8669 Personal history of other diseases of the nervous system and sense organs: Secondary | ICD-10-CM | POA: Diagnosis not present

## 2021-01-12 DIAGNOSIS — Z87898 Personal history of other specified conditions: Secondary | ICD-10-CM | POA: Diagnosis not present

## 2021-01-12 DIAGNOSIS — Z0000 Encounter for general adult medical examination without abnormal findings: Secondary | ICD-10-CM | POA: Diagnosis not present

## 2021-01-12 DIAGNOSIS — N951 Menopausal and female climacteric states: Secondary | ICD-10-CM | POA: Diagnosis not present

## 2021-01-13 LAB — LIPID PANEL
Cholesterol: 160 mg/dL (ref 0–200)
HDL: 52.4 mg/dL (ref >39.00)
LDL Cholesterol: 93 mg/dL (ref 0–99)
NonHDL: 107.91
Total CHOL/HDL Ratio: 3
Triglycerides: 73 mg/dL (ref 0.0–149.0)
VLDL: 14.6 mg/dL (ref 0.0–40.0)

## 2021-01-13 LAB — BASIC METABOLIC PANEL
BUN: 12 mg/dL (ref 6–23)
CO2: 24 mEq/L (ref 19–32)
Calcium: 9.4 mg/dL (ref 8.4–10.5)
Chloride: 105 mEq/L (ref 96–112)
Creatinine, Ser: 0.83 mg/dL (ref 0.40–1.20)
GFR: 85.24 mL/min (ref >60.00)
Glucose, Bld: 76 mg/dL (ref 70–99)
Potassium: 3.9 mEq/L (ref 3.5–5.1)
Sodium: 138 mEq/L (ref 135–145)

## 2021-01-13 LAB — HEPATIC FUNCTION PANEL
ALT: 35 U/L (ref 0–35)
AST: 34 U/L (ref 0–37)
Albumin: 4.4 g/dL (ref 3.5–5.2)
Alkaline Phosphatase: 56 U/L (ref 39–117)
Bilirubin, Direct: 0.1 mg/dL (ref 0.0–0.3)
Total Bilirubin: 0.6 mg/dL (ref 0.2–1.2)
Total Protein: 6.7 g/dL (ref 6.0–8.3)

## 2021-01-13 LAB — HEPATITIS C ANTIBODY
Hepatitis C Ab: NONREACTIVE
SIGNAL TO CUT-OFF: 0 (ref <1.00)

## 2021-01-13 LAB — C-REACTIVE PROTEIN: CRP: <1 mg/dL (ref 0.5–20.0)

## 2021-01-18 NOTE — Progress Notes (Signed)
Levels in range  including  lipids   inflammation markers are normal.

## 2021-01-23 NOTE — Telephone Encounter (Unsigned)
    I do not have a concern about the numbers levels at this time.  They are still within range and can fluctuate with many benign causes  Not diagnostic of any disease would recommend repeating it in a year And of course  avoid unnecessary medications, supplements  and  continue healthy life style.  Also  hep c screen was negative .

## 2021-02-23 DIAGNOSIS — L300 Nummular dermatitis: Secondary | ICD-10-CM | POA: Diagnosis not present

## 2021-02-23 DIAGNOSIS — L309 Dermatitis, unspecified: Secondary | ICD-10-CM | POA: Diagnosis not present

## 2021-04-02 DIAGNOSIS — Z1321 Encounter for screening for nutritional disorder: Secondary | ICD-10-CM | POA: Diagnosis not present

## 2021-04-02 DIAGNOSIS — N809 Endometriosis, unspecified: Secondary | ICD-10-CM | POA: Diagnosis not present

## 2021-04-02 DIAGNOSIS — E349 Endocrine disorder, unspecified: Secondary | ICD-10-CM | POA: Diagnosis not present

## 2021-04-02 DIAGNOSIS — G43909 Migraine, unspecified, not intractable, without status migrainosus: Secondary | ICD-10-CM | POA: Diagnosis not present

## 2021-04-14 IMAGING — US ULTRASOUND LEFT BREAST LIMITED
1 series · 6 of 6 positions shown · non-contrast
Comparison: Previous exam(s).

CLINICAL DATA: 43-year-old female recalled from screening mammogram
dated 02/13/2019 for a possible left breast mass.

EXAM:
DIGITAL DIAGNOSTIC LEFT MAMMOGRAM WITH CAD AND TOMO
ULTRASOUND LEFT BREAST

[Series 1: ultrasound left breast limited · 0.07mm/px · 6 of 6 slices shown]
[im 1/6]
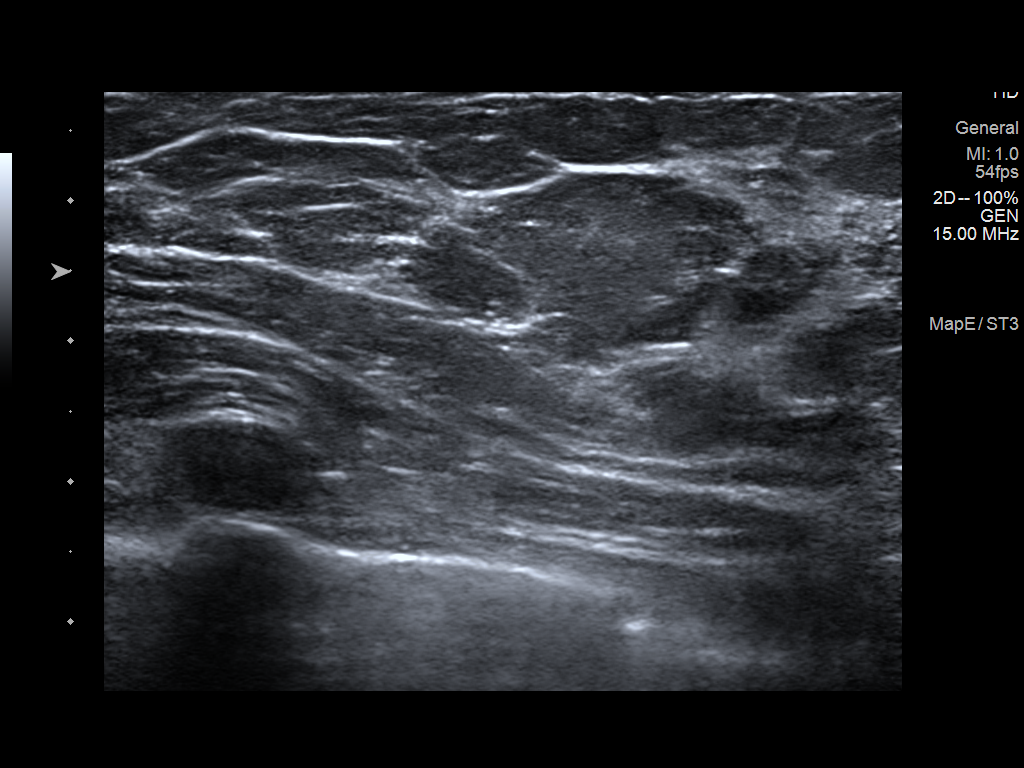
[im 2/6]
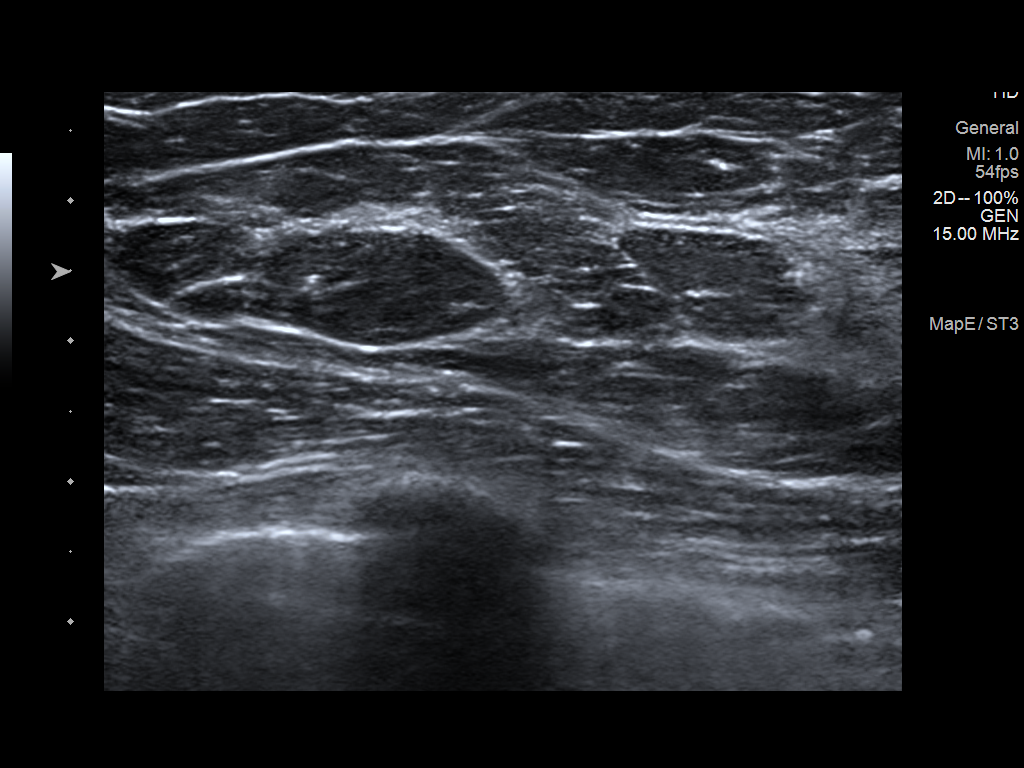
[im 3/6]
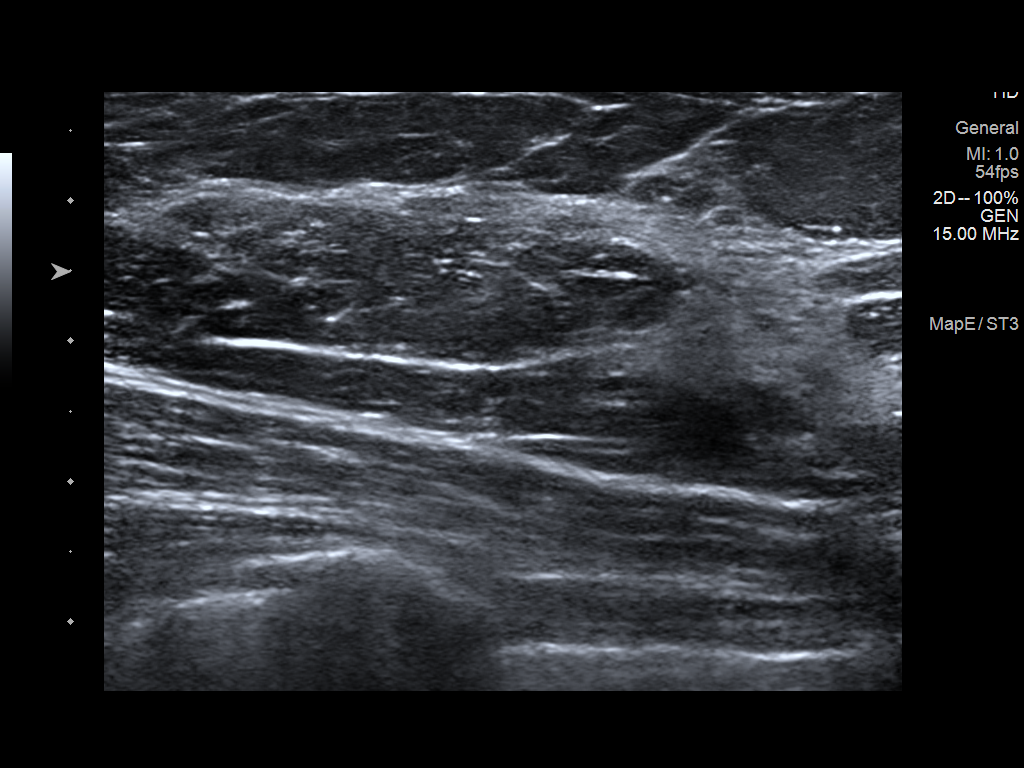
[im 4/6]
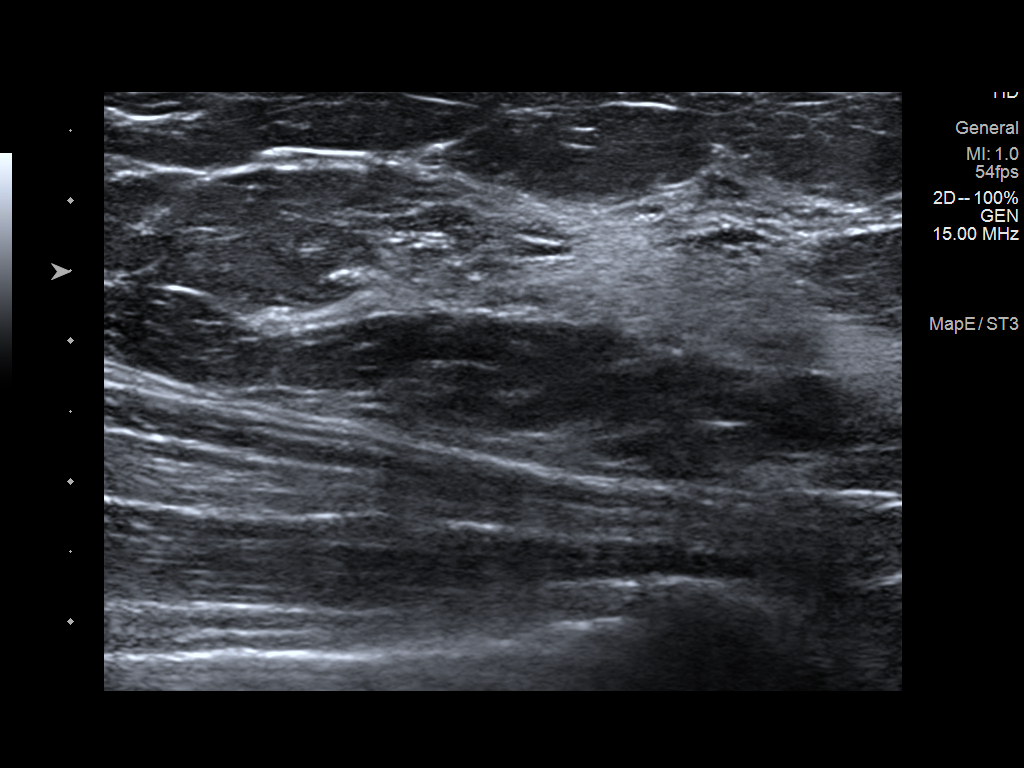
[im 5/6]
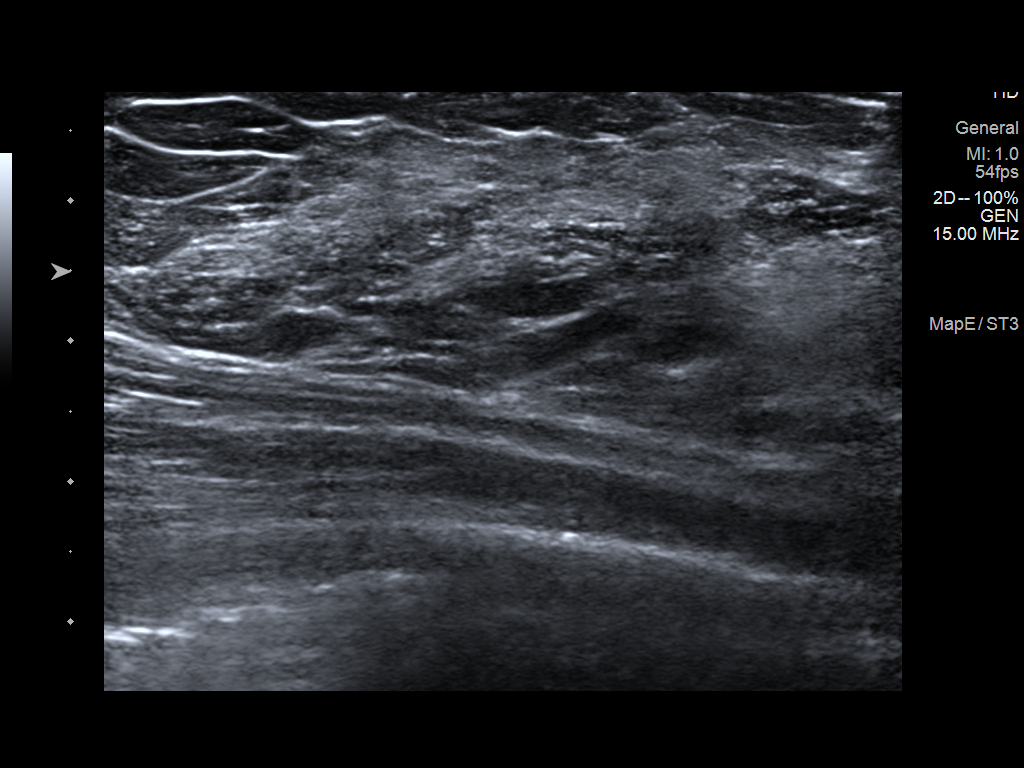
[im 6/6]
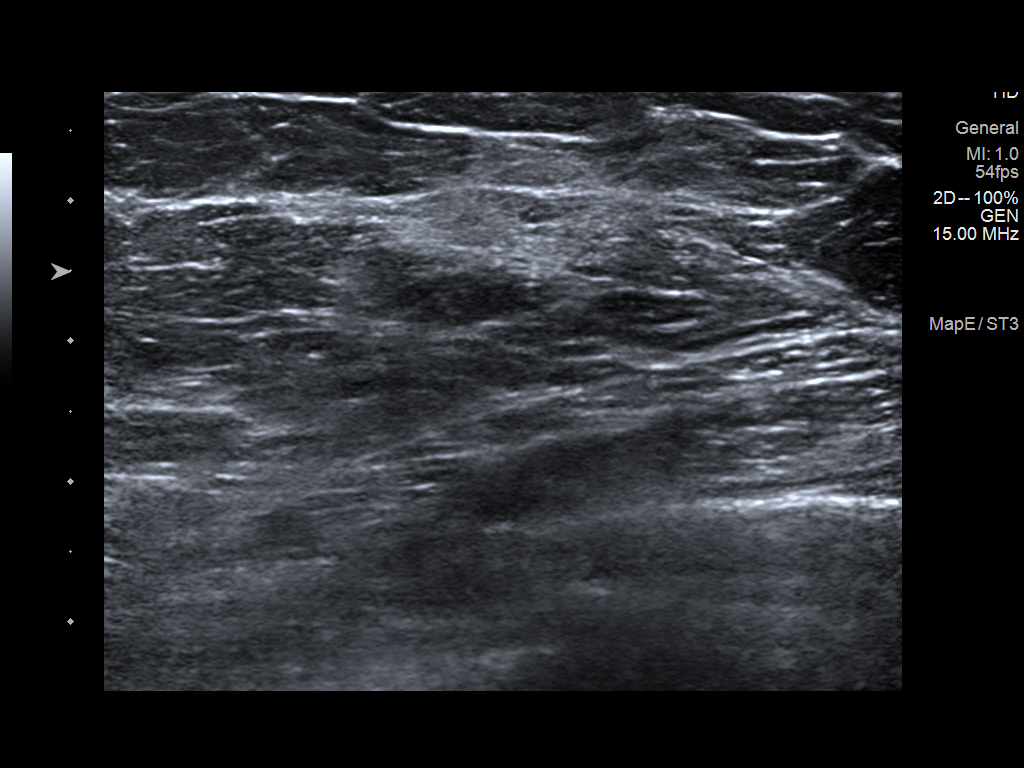

[6 of 6 positions shown; findings below may reference images not displayed]

ACR Breast Density Category c: The breast tissue is heterogeneously
dense, which may obscure small masses.
FINDINGS: Previously described, possible mass in the superior left breast is
seen on the MLO projection does not persist on today's additional
views. Precautionary evaluation with ultrasound was performed.

Mammographic images were processed with CAD.

Targeted ultrasound is performed, showing normal fibroglandular
tissue without focal or suspicious sonographic abnormality.
Evaluation of the upper outer left breast was performed.
IMPRESSION: No persistent, suspicious mammographic or sonographic findings.

RECOMMENDATION:
Screening mammogram in one year.(Code:[id number])

I have discussed the findings and recommendations with the patient.
Results were also provided in writing at the conclusion of the
visit. If applicable, a reminder letter will be sent to the patient
regarding the next appointment.

BI-RADS CATEGORY  1: Negative.

## 2021-05-01 DIAGNOSIS — Z01419 Encounter for gynecological examination (general) (routine) without abnormal findings: Secondary | ICD-10-CM | POA: Diagnosis not present

## 2021-05-01 DIAGNOSIS — Z1231 Encounter for screening mammogram for malignant neoplasm of breast: Secondary | ICD-10-CM | POA: Diagnosis not present

## 2021-05-25 DIAGNOSIS — L710 Perioral dermatitis: Secondary | ICD-10-CM | POA: Diagnosis not present

## 2021-06-02 DIAGNOSIS — B3732 Chronic candidiasis of vulva and vagina: Secondary | ICD-10-CM | POA: Diagnosis not present

## 2021-06-02 DIAGNOSIS — N760 Acute vaginitis: Secondary | ICD-10-CM | POA: Diagnosis not present

## 2021-08-11 DIAGNOSIS — L728 Other follicular cysts of the skin and subcutaneous tissue: Secondary | ICD-10-CM | POA: Diagnosis not present

## 2021-08-11 DIAGNOSIS — L710 Perioral dermatitis: Secondary | ICD-10-CM | POA: Diagnosis not present

## 2021-08-11 DIAGNOSIS — L83 Acanthosis nigricans: Secondary | ICD-10-CM | POA: Diagnosis not present

## 2021-08-11 DIAGNOSIS — L249 Irritant contact dermatitis, unspecified cause: Secondary | ICD-10-CM | POA: Diagnosis not present

## 2022-01-11 ENCOUNTER — Encounter: Payer: BC Managed Care – PPO | Admitting: Internal Medicine

## 2022-01-14 ENCOUNTER — Telehealth: Payer: Self-pay | Admitting: Internal Medicine

## 2022-01-14 NOTE — Telephone Encounter (Signed)
Pt call and stated she want you to give her a call about some lab work.

## 2022-01-14 NOTE — Telephone Encounter (Signed)
Pt states she had blood work drawn by [facility] & there she noted there was increase protein in her blood work. Pt has OV with PCP on 02/02/22 & is aware that PCP is out of office until June 14; she is also aware that we do not have a copy of this blood work. Pt denies any symptoms of anything. Advised to bring lab work to appt on 02/02/22 or fax to [place] prior to appt. Pt verb understanding.

## 2022-02-02 ENCOUNTER — Encounter: Payer: Self-pay | Admitting: Internal Medicine

## 2022-02-02 ENCOUNTER — Ambulatory Visit (INDEPENDENT_AMBULATORY_CARE_PROVIDER_SITE_OTHER): Payer: BC Managed Care – PPO | Admitting: Internal Medicine

## 2022-02-02 ENCOUNTER — Telehealth: Payer: Self-pay | Admitting: Internal Medicine

## 2022-02-02 VITALS — BP 160/100 | HR 79 | Temp 98.5°F | Ht 63.5 in | Wt 140.0 lb

## 2022-02-02 DIAGNOSIS — Z0000 Encounter for general adult medical examination without abnormal findings: Secondary | ICD-10-CM | POA: Diagnosis not present

## 2022-02-02 DIAGNOSIS — Z6379 Other stressful life events affecting family and household: Secondary | ICD-10-CM | POA: Diagnosis not present

## 2022-02-02 DIAGNOSIS — N926 Irregular menstruation, unspecified: Secondary | ICD-10-CM

## 2022-02-02 DIAGNOSIS — Z8249 Family history of ischemic heart disease and other diseases of the circulatory system: Secondary | ICD-10-CM | POA: Diagnosis not present

## 2022-02-02 DIAGNOSIS — R030 Elevated blood-pressure reading, without diagnosis of hypertension: Secondary | ICD-10-CM

## 2022-02-02 DIAGNOSIS — N951 Menopausal and female climacteric states: Secondary | ICD-10-CM

## 2022-02-02 DIAGNOSIS — Z8669 Personal history of other diseases of the nervous system and sense organs: Secondary | ICD-10-CM

## 2022-02-02 LAB — BASIC METABOLIC PANEL
BUN: 10 mg/dL (ref 6–23)
CO2: 27 mEq/L (ref 19–32)
Calcium: 10.2 mg/dL (ref 8.4–10.5)
Chloride: 101 mEq/L (ref 96–112)
Creatinine, Ser: 0.88 mg/dL (ref 0.40–1.20)
GFR: 78.88 mL/min (ref >60.00)
Glucose, Bld: 89 mg/dL (ref 70–99)
Potassium: 3.2 mEq/L — ABNORMAL LOW (ref 3.5–5.1)
Sodium: 137 mEq/L (ref 135–145)

## 2022-02-02 LAB — LIPID PANEL
Cholesterol: 178 mg/dL (ref 0–200)
HDL: 59.1 mg/dL (ref >39.00)
LDL Cholesterol: 97 mg/dL (ref 0–99)
NonHDL: 118.81
Total CHOL/HDL Ratio: 3
Triglycerides: 107 mg/dL (ref 0.0–149.0)
VLDL: 21.4 mg/dL (ref 0.0–40.0)

## 2022-02-02 LAB — CBC WITH DIFFERENTIAL/PLATELET
Basophils Absolute: 0 10*3/uL (ref 0.0–0.1)
Basophils Relative: 0.4 % (ref 0.0–3.0)
Eosinophils Absolute: 0.2 10*3/uL (ref 0.0–0.7)
Eosinophils Relative: 3.5 % (ref 0.0–5.0)
HCT: 40.7 % (ref 36.0–46.0)
Hemoglobin: 13.7 g/dL (ref 12.0–15.0)
Lymphocytes Relative: 19.4 % (ref 12.0–46.0)
Lymphs Abs: 1 10*3/uL (ref 0.7–4.0)
MCHC: 33.6 g/dL (ref 30.0–36.0)
MCV: 90.3 fl (ref 78.0–100.0)
Monocytes Absolute: 0.4 10*3/uL (ref 0.1–1.0)
Monocytes Relative: 7.6 % (ref 3.0–12.0)
Neutro Abs: 3.4 10*3/uL (ref 1.4–7.7)
Neutrophils Relative %: 69.1 % (ref 43.0–77.0)
Platelets: 213 10*3/uL (ref 150.0–400.0)
RBC: 4.51 Mil/uL (ref 3.87–5.11)
RDW: 13.6 % (ref 11.5–15.5)
WBC: 4.9 10*3/uL (ref 4.0–10.5)

## 2022-02-02 LAB — HEPATIC FUNCTION PANEL
ALT: 13 U/L (ref 0–35)
AST: 19 U/L (ref 0–37)
Albumin: 4.7 g/dL (ref 3.5–5.2)
Alkaline Phosphatase: 68 U/L (ref 39–117)
Bilirubin, Direct: 0.1 mg/dL (ref 0.0–0.3)
Total Bilirubin: 0.7 mg/dL (ref 0.2–1.2)
Total Protein: 7.9 g/dL (ref 6.0–8.3)

## 2022-02-02 LAB — MICROALBUMIN / CREATININE URINE RATIO
Creatinine,U: 24.4 mg/dL
Microalb Creat Ratio: 28.6 mg/g (ref 0.0–30.0)
Microalb, Ur: 7 mg/dL — ABNORMAL HIGH (ref 0.0–1.9)

## 2022-02-02 LAB — HEMOGLOBIN A1C: Hgb A1c MFr Bld: 5.1 % (ref 4.6–6.5)

## 2022-02-02 LAB — TSH: TSH: 2.56 u[IU]/mL (ref 0.35–5.50)

## 2022-02-02 LAB — T4, FREE: Free T4: 1.04 ng/dL (ref 0.60–1.60)

## 2022-02-02 MED ORDER — AMLODIPINE BESYLATE 2.5 MG PO TABS: 2.5000 mg | ORAL_TABLET | Freq: Every day | ORAL | 2 refills | Status: DC

## 2022-02-02 NOTE — Telephone Encounter (Signed)
Patient seen this morning  Please advise

## 2022-02-02 NOTE — Telephone Encounter (Signed)
Patient wanted Dr.Panosh to be made aware that the blood in her urine is due to her spotting. Patient states that she has been spotting for a while and believes she is about to start menstrual cycle.      Please advise

## 2022-02-03 LAB — THYROID PEROXIDASE ANTIBODY: Thyroperoxidase Ab SerPl-aCnc: 1 IU/mL (ref <9)

## 2022-02-03 NOTE — Telephone Encounter (Signed)
Noted  

## 2022-02-03 NOTE — Progress Notes (Signed)
Results look good except for potassium is low.  No increase protein  in urine.  Low potassium usually seen  with diuretics   or  wonder if supplements are a cause ?  Or have a diuretic in them.  This also could be a lab  error.  Suggest  eat high potassium foods for now and repeat  bmp magnesium when possible.  Low potassium could cause leg cramps sx .

## 2022-02-05 ENCOUNTER — Other Ambulatory Visit: Payer: Self-pay

## 2022-02-05 DIAGNOSIS — Z79899 Other long term (current) drug therapy: Secondary | ICD-10-CM

## 2022-04-29 ENCOUNTER — Other Ambulatory Visit: Payer: Self-pay | Admitting: Internal Medicine

## 2022-05-06 DIAGNOSIS — Z1231 Encounter for screening mammogram for malignant neoplasm of breast: Secondary | ICD-10-CM | POA: Diagnosis not present

## 2022-05-06 DIAGNOSIS — Z01419 Encounter for gynecological examination (general) (routine) without abnormal findings: Secondary | ICD-10-CM | POA: Diagnosis not present

## 2022-05-07 ENCOUNTER — Telehealth: Payer: Self-pay | Admitting: Internal Medicine

## 2022-05-07 ENCOUNTER — Other Ambulatory Visit: Payer: Self-pay | Admitting: Obstetrics and Gynecology

## 2022-05-07 DIAGNOSIS — R928 Other abnormal and inconclusive findings on diagnostic imaging of breast: Secondary | ICD-10-CM

## 2022-05-07 NOTE — Telephone Encounter (Signed)
error 

## 2022-05-10 NOTE — Telephone Encounter (Signed)
Caller states she wants to speak with a nurse regarding bp medication. She wants advice. She came in for her annual exam and she was put on a low dose bp medication. She has not been taking it though. She took it for the first time today and yesterday. 140/93 was the most recent.   05/07/2022 9:06:19 AM Attempt made - message left Bonnetta Barry 05/07/2022 9:32:11 AM Attempt made - message left Bonnetta Barry 05/07/2022 9:41:30 AM FINAL ATTEMPT MADE - message left Yes Toy Cookey RN, [facility]  Final Disposition 05/07/2022 9:41:30 AM FINAL ATTEMPT MADE - message left Yes Toy Cookey RN, [facility]

## 2022-05-13 ENCOUNTER — Ambulatory Visit: Payer: BC Managed Care – PPO

## 2022-05-13 ENCOUNTER — Ambulatory Visit
Admission: RE | Admit: 2022-05-13 | Discharge: 2022-05-13 | Disposition: A | Discharge status: Home / Self Care | Payer: BC Managed Care – PPO | Source: Ambulatory Visit | Attending: Obstetrics and Gynecology | Admitting: Obstetrics and Gynecology

## 2022-05-13 DIAGNOSIS — R922 Inconclusive mammogram: Secondary | ICD-10-CM | POA: Diagnosis not present

## 2022-05-13 DIAGNOSIS — R928 Other abnormal and inconclusive findings on diagnostic imaging of breast: Secondary | ICD-10-CM

## 2022-05-17 ENCOUNTER — Telehealth: Payer: Self-pay | Admitting: Internal Medicine

## 2022-05-17 NOTE — Telephone Encounter (Signed)
[name]   DS   05/17/22 10:47 AM Note Pt returned a call to state she is taking her medication and has no issues with her blood pressure

## 2022-05-17 NOTE — Telephone Encounter (Signed)
OV 02/02/22 notes: I will write for a low-dose of amlodipine in case her blood pressure remains high however at this point this may be blood pressure reaction although she may have underlying risk of hypertension as she gets older because of her family history. Reviewed her previous evaluation such as her coronary score of 0 and her echocardiogram which was basically normal done last year. Plan follow-up depending on blood pressure results.  Always the option to see cardiology with a family history.  From her mom who is in her 38s although does not sound like familial cardiomyopathy.   Return for depending on results and BP.  LVM instructions to determine if pt taking her meds and/or needs f/u appt for BP.

## 2022-05-17 NOTE — Telephone Encounter (Signed)
Noted. See phone encounter from 05/07/22

## 2022-05-17 NOTE — Telephone Encounter (Signed)
Pt returned a call to state she is taking her medication and has no issues with her blood pressure

## 2022-06-09 ENCOUNTER — Encounter: Payer: Self-pay | Admitting: Internal Medicine

## 2022-11-30 DIAGNOSIS — L720 Epidermal cyst: Secondary | ICD-10-CM | POA: Diagnosis not present

## 2022-11-30 DIAGNOSIS — L728 Other follicular cysts of the skin and subcutaneous tissue: Secondary | ICD-10-CM | POA: Diagnosis not present

## 2022-12-28 DIAGNOSIS — R238 Other skin changes: Secondary | ICD-10-CM | POA: Diagnosis not present

## 2022-12-28 DIAGNOSIS — L720 Epidermal cyst: Secondary | ICD-10-CM | POA: Diagnosis not present

## 2023-01-14 ENCOUNTER — Other Ambulatory Visit: Payer: Self-pay | Admitting: Internal Medicine

## 2023-02-08 ENCOUNTER — Encounter: Payer: BC Managed Care – PPO | Admitting: Internal Medicine

## 2023-02-14 NOTE — Progress Notes (Unsigned)
No chief complaint on file.   HPI: Patient  [name]  47 y.o. comes in today for Preventive Health Care visit  Last pv 6 23  Has gyne checks   Health Maintenance  Topic Date Due   COVID-19 Vaccine (3 - 2023-24 season) 04/09/2022   PAP SMEAR-Modifier  03/13/2023   INFLUENZA VACCINE  03/10/2023   DTaP/Tdap/Td (2 - Td or Tdap) 11/19/2024   Colonoscopy  10/10/2028   Hepatitis C Screening  Completed   HIV Screening  Completed   HPV VACCINES  Aged Out   Health Maintenance Review LIFESTYLE:  Exercise:   Tobacco/ETS: Alcohol:  Sugar beverages: Sleep: Drug use: no HH of  Work:    ROS:  GEN/ HEENT: No fever, significant weight changes sweats headaches vision problems hearing changes, CV/ PULM; No chest pain shortness of breath cough, syncope,edema  change in exercise tolerance. GI /GU: No adominal pain, vomiting, change in bowel habits. No blood in the stool. No significant GU symptoms. SKIN/HEME: ,no acute skin rashes suspicious lesions or bleeding. No lymphadenopathy, nodules, masses.  NEURO/ PSYCH:  No neurologic signs such as weakness numbness. No depression anxiety. IMM/ Allergy: No unusual infections.  Allergy .   REST of 12 system review negative except as per HPI   Past Medical History:  Diagnosis Date   Borderline hypertension    Endometriosis    surgery 2009   Frequency of urination    Hx of varicella    Left ureteral stone    Migraine    PONV (postoperative nausea and vomiting)    Urgency of urination     Past Surgical History:  Procedure Laterality Date   COLONOSCOPY  10/11/2018   CYSTOSCOPY/RETROGRADE/URETEROSCOPY/STONE EXTRACTION WITH BASKET Left 02/13/2016   Procedure: CYSTOSCOPY/RETROGRADE/URETEROSCOPY/STONE EXTRACTION WITH BASKET;  Surgeon: Marcine Matar, MD;  Location: [facility];  Service: Urology;  Laterality: Left;   LAPAROSCOPY LYSIS ADHESIONS/  ABLATION AND BX'S OF ENDOMETRIAL LESIONS/  RIGHT FIMBROPLASTY   10-02-2007   NASAL SEPTUM SURGERY  02/ 2016   TONSILLECTOMY  1985    Family History  Problem Relation Age of Onset   Hypertension Mother    Breast cancer Mother        age 47    Heart failure Mother        after breast cancer rx has icdalso    Hypertension Father    Healthy Son    Healthy Son     Social History   Socioeconomic History   Marital status: Married    Spouse name: Not on file   Number of children: Not on file   Years of education: Not on file   Highest education level: Not on file  Occupational History   Not on file  Tobacco Use   Smoking status: Never   Smokeless tobacco: Never  Vaping Use   Vaping Use: Never used  Substance and Sexual Activity   Alcohol use: Yes    Alcohol/week: 0.0 standard drinks of alcohol    Comment: social   Drug use: No   Sexual activity: Yes    Partners: Male    Birth control/protection: I.U.D.    Comment: Mirena IUD placed 2016 approx  Other Topics Concern   Not on file  Social History Narrative   6-8 hours of sleep per night   Does not work outside home   Married for 12 years with 2 children   Children are 81yrs and 5 yrs.   No pets  etoh social    Hot yoga  And walking    g2 p2   Born raised  [place]   College degree univ [place]   Social Determinants of Health   Financial Resource Strain: Not on file  Food Insecurity: Not on file  Transportation Needs: Not on file  Physical Activity: Not on file  Stress: Not on file  Social Connections: Not on file    Outpatient Medications Prior to Visit  Medication Sig Dispense Refill   amLODipine (NORVASC) 2.5 MG tablet TAKE 1 TABLET BY MOUTH EVERY DAY 90 tablet 1   Diclofenac Potassium (ZIPSOR) 25 MG CAPS Take 1 every 8 hours as needed for migraine headache. 30 capsule 2   Magnesium 400 MG CAPS Take 1 tablet by mouth daily.     metroNIDAZOLE (METROCREAM) 0.75 % cream as needed.     Multiple Vitamins-Minerals (HAIR VITAMINS) TABS Take 1  tablet by mouth daily.     Naproxen Sodium 220 MG CAPS Take by mouth as needed.     Probiotic Product (PROBIOTIC DAILY) CAPS Take 1 capsule by mouth daily.     tretinoin (RETIN-A) 0.05 % cream tretinoin 0.05 % topical cream  APPLY TO AFFECTED AREA AS NEEDED     ZOLMitriptan (ZOMIG) 2.5 MG tablet Take 1 tablet (2.5 mg total) by mouth as needed for migraine. 10 tablet 2   No facility-administered medications prior to visit.     EXAM:  There were no vitals taken for this visit.  There is no height or weight on file to calculate BMI. Wt Readings from Last 3 Encounters:  02/02/22 140 lb (63.5 kg)  01/06/21 131 lb 12.8 oz (59.8 kg)  07/23/20 131 lb 6.4 oz (59.6 kg)    Physical Exam: Vital signs reviewed ZOX:WRUE is a well-developed well-nourished alert cooperative    who appearsr stated age in no acute distress.  HEENT: normocephalic atraumatic , Eyes: PERRL EOM's full, conjunctiva clear, Nares: paten,t no deformity discharge or tenderness., Ears: no deformity EAC's clear TMs with normal landmarks. Mouth: clear OP, no lesions, edema.  Moist mucous membranes. Dentition in adequate repair. NECK: supple without masses, thyromegaly or bruits. CHEST/PULM:  Clear to auscultation and percussion breath sounds equal no wheeze , rales or rhonchi. No chest wall deformities or tenderness. Breast: normal by inspection . No dimpling, discharge, masses, tenderness or discharge . CV: PMI is nondisplaced, S1 S2 no gallops, murmurs, rubs. Peripheral pulses are full without delay.No JVD .  ABDOMEN: Bowel sounds normal nontender  No guard or rebound, no hepato splenomegal no CVA tenderness.   Extremtities:  No clubbing cyanosis or edema, no acute joint swelling or redness no focal atrophy NEURO:  Oriented x3, cranial nerves 3-12 appear to be intact, no obvious focal weakness,gait within normal limits no abnormal reflexes or asymmetrical SKIN: No acute rashes normal turgor, color, no bruising or  petechiae. PSYCH: Oriented, good eye contact, no obvious depression anxiety, cognition and judgment appear normal. LN: no cervical axillary adenopathy  Lab Results  Component Value Date   WBC 4.9 02/02/2022   HGB 13.7 02/02/2022   HCT 40.7 02/02/2022   PLT 213.0 02/02/2022   GLUCOSE 89 02/02/2022   CHOL 178 02/02/2022   TRIG 107.0 02/02/2022   HDL 59.10 02/02/2022   LDLCALC 97 02/02/2022   ALT 13 02/02/2022   AST 19 02/02/2022   NA 137 02/02/2022   K 3.2 (L) 02/02/2022   CL 101 02/02/2022   CREATININE 0.88 02/02/2022  BUN 10 02/02/2022   CO2 27 02/02/2022   TSH 2.56 02/02/2022   HGBA1C 5.1 02/02/2022   MICROALBUR 7.0 (H) 02/02/2022    BP Readings from Last 3 Encounters:  02/02/22 (!) 160/100  01/06/21 (!) 144/80  07/23/20 130/90    Lab results reviewed with patient   ASSESSMENT AND PLAN:  Discussed the following assessment and plan:    ICD-10-CM   1. Family history of heart disease  Z82.49     2. Visit for preventive health examination  Z00.00     3. Medication management  Z79.899     4. History of migraine  Z86.69      No follow-ups on file.  Patient Care Team: Vyncent Overby, Neta Mends, MD as PCP - General (Internal Medicine) Clyda Hurdle (Physician Assistant) Carrington Clamp, MD as Consulting Physician (Obstetrics and Gynecology) There are no Patient Instructions on file for this visit.  Neta Mends. Mahin Guardia M.D.

## 2023-02-15 ENCOUNTER — Ambulatory Visit: Payer: BC Managed Care – PPO | Admitting: Internal Medicine

## 2023-02-15 ENCOUNTER — Encounter: Payer: Self-pay | Admitting: Internal Medicine

## 2023-02-15 VITALS — BP 118/70 | HR 82 | Temp 98.2°F | Ht 63.5 in | Wt 136.1 lb

## 2023-02-15 DIAGNOSIS — Z0000 Encounter for general adult medical examination without abnormal findings: Secondary | ICD-10-CM

## 2023-02-15 DIAGNOSIS — Z13220 Encounter for screening for lipoid disorders: Secondary | ICD-10-CM

## 2023-02-15 DIAGNOSIS — R030 Elevated blood-pressure reading, without diagnosis of hypertension: Secondary | ICD-10-CM

## 2023-02-15 DIAGNOSIS — Z8669 Personal history of other diseases of the nervous system and sense organs: Secondary | ICD-10-CM | POA: Diagnosis not present

## 2023-02-15 DIAGNOSIS — Z79899 Other long term (current) drug therapy: Secondary | ICD-10-CM

## 2023-02-15 DIAGNOSIS — Z8249 Family history of ischemic heart disease and other diseases of the circulatory system: Secondary | ICD-10-CM | POA: Diagnosis not present

## 2023-02-15 DIAGNOSIS — N951 Menopausal and female climacteric states: Secondary | ICD-10-CM | POA: Diagnosis not present

## 2023-02-15 LAB — HEPATIC FUNCTION PANEL
ALT: 17 U/L (ref 0–35)
AST: 22 U/L (ref 0–37)
Albumin: 4.6 g/dL (ref 3.5–5.2)
Alkaline Phosphatase: 64 U/L (ref 39–117)
Bilirubin, Direct: 0.2 mg/dL (ref 0.0–0.3)
Total Bilirubin: 0.8 mg/dL (ref 0.2–1.2)
Total Protein: 7.5 g/dL (ref 6.0–8.3)

## 2023-02-15 LAB — CBC WITH DIFFERENTIAL/PLATELET
Basophils Absolute: 0 10*3/uL (ref 0.0–0.1)
Basophils Relative: 0.3 % (ref 0.0–3.0)
Eosinophils Absolute: 0.2 10*3/uL (ref 0.0–0.7)
Eosinophils Relative: 2.6 % (ref 0.0–5.0)
HCT: 40.7 % (ref 36.0–46.0)
Hemoglobin: 13.5 g/dL (ref 12.0–15.0)
Lymphocytes Relative: 17.9 % (ref 12.0–46.0)
Lymphs Abs: 1.1 10*3/uL (ref 0.7–4.0)
MCHC: 33.2 g/dL (ref 30.0–36.0)
MCV: 90.9 fl (ref 78.0–100.0)
Monocytes Absolute: 0.5 10*3/uL (ref 0.1–1.0)
Monocytes Relative: 8.2 % (ref 3.0–12.0)
Neutro Abs: 4.3 10*3/uL (ref 1.4–7.7)
Neutrophils Relative %: 71 % (ref 43.0–77.0)
Platelets: 243 10*3/uL (ref 150.0–400.0)
RBC: 4.47 Mil/uL (ref 3.87–5.11)
RDW: 13.4 % (ref 11.5–15.5)
WBC: 6 10*3/uL (ref 4.0–10.5)

## 2023-02-15 LAB — LIPID PANEL
Cholesterol: 185 mg/dL (ref 0–200)
HDL: 60 mg/dL (ref >39.00)
LDL Cholesterol: 111 mg/dL — ABNORMAL HIGH (ref 0–99)
NonHDL: 125.07
Total CHOL/HDL Ratio: 3
Triglycerides: 72 mg/dL (ref 0.0–149.0)
VLDL: 14.4 mg/dL (ref 0.0–40.0)

## 2023-02-15 LAB — BASIC METABOLIC PANEL
BUN: 12 mg/dL (ref 6–23)
CO2: 24 mEq/L (ref 19–32)
Calcium: 9.9 mg/dL (ref 8.4–10.5)
Chloride: 102 mEq/L (ref 96–112)
Creatinine, Ser: 0.81 mg/dL (ref 0.40–1.20)
GFR: 86.5 mL/min (ref >60.00)
Glucose, Bld: 73 mg/dL (ref 70–99)
Potassium: 3.7 mEq/L (ref 3.5–5.1)
Sodium: 135 mEq/L (ref 135–145)

## 2023-02-15 LAB — HEMOGLOBIN A1C: Hgb A1c MFr Bld: 4.9 % (ref 4.6–6.5)

## 2023-02-15 LAB — TSH: TSH: 1.82 u[IU]/mL (ref 0.35–5.50)

## 2023-02-15 NOTE — Patient Instructions (Signed)
Good to see you today .  Continue lifestyle intervention healthy eating and exercise .   Lab today . Can try off BP med   Send in readings  after off for 3-4 weeks   twice a day readings  x 2 for 5-7 days and go form there .

## 2023-02-16 NOTE — Progress Notes (Signed)
Results normal range except ldl up slightly ( prob from vacation )  no diabetes  Awaiting on lipo a  reading .   Continue  attention to lifestyle intervention healthy eating and exercise .

## 2023-02-17 LAB — LIPOPROTEIN A (LPA): Lipoprotein (a): 44 nmol/L (ref <75)

## 2023-03-07 NOTE — Progress Notes (Signed)
Lipo a is in favorable range

## 2023-03-14 DIAGNOSIS — L821 Other seborrheic keratosis: Secondary | ICD-10-CM | POA: Diagnosis not present

## 2023-03-14 DIAGNOSIS — D225 Melanocytic nevi of trunk: Secondary | ICD-10-CM | POA: Diagnosis not present

## 2023-03-14 DIAGNOSIS — L814 Other melanin hyperpigmentation: Secondary | ICD-10-CM | POA: Diagnosis not present

## 2023-05-16 DIAGNOSIS — Z1231 Encounter for screening mammogram for malignant neoplasm of breast: Secondary | ICD-10-CM | POA: Diagnosis not present

## 2023-05-16 DIAGNOSIS — Z01419 Encounter for gynecological examination (general) (routine) without abnormal findings: Secondary | ICD-10-CM | POA: Diagnosis not present

## 2023-05-20 ENCOUNTER — Other Ambulatory Visit: Payer: Self-pay | Admitting: Obstetrics and Gynecology

## 2023-05-20 DIAGNOSIS — R928 Other abnormal and inconclusive findings on diagnostic imaging of breast: Secondary | ICD-10-CM

## 2023-05-26 ENCOUNTER — Ambulatory Visit
Admission: RE | Admit: 2023-05-26 | Discharge: 2023-05-26 | Disposition: A | Discharge status: Home / Self Care | Payer: BC Managed Care – PPO | Source: Ambulatory Visit | Attending: Obstetrics and Gynecology | Admitting: Obstetrics and Gynecology

## 2023-05-26 ENCOUNTER — Other Ambulatory Visit: Payer: Self-pay | Admitting: Obstetrics and Gynecology

## 2023-05-26 DIAGNOSIS — N6310 Unspecified lump in the right breast, unspecified quadrant: Secondary | ICD-10-CM

## 2023-05-26 DIAGNOSIS — N6311 Unspecified lump in the right breast, upper outer quadrant: Secondary | ICD-10-CM | POA: Diagnosis not present

## 2023-05-26 DIAGNOSIS — R928 Other abnormal and inconclusive findings on diagnostic imaging of breast: Secondary | ICD-10-CM

## 2023-05-26 DIAGNOSIS — D241 Benign neoplasm of right breast: Secondary | ICD-10-CM | POA: Diagnosis not present

## 2023-05-26 HISTORY — PX: BREAST BIOPSY: SHX20

## 2023-05-27 LAB — SURGICAL PATHOLOGY

## 2023-07-17 ENCOUNTER — Other Ambulatory Visit: Payer: Self-pay | Admitting: Internal Medicine

## 2024-01-10 ENCOUNTER — Other Ambulatory Visit: Payer: Self-pay | Admitting: Family

## 2024-02-13 ENCOUNTER — Telehealth: Payer: Self-pay

## 2024-02-13 DIAGNOSIS — R030 Elevated blood-pressure reading, without diagnosis of hypertension: Secondary | ICD-10-CM

## 2024-02-13 DIAGNOSIS — Z0000 Encounter for general adult medical examination without abnormal findings: Secondary | ICD-10-CM

## 2024-02-13 DIAGNOSIS — Z13220 Encounter for screening for lipoid disorders: Secondary | ICD-10-CM

## 2024-02-13 DIAGNOSIS — Z79899 Other long term (current) drug therapy: Secondary | ICD-10-CM

## 2024-02-13 DIAGNOSIS — Z8249 Family history of ischemic heart disease and other diseases of the circulatory system: Secondary | ICD-10-CM

## 2024-02-13 NOTE — Telephone Encounter (Signed)
 Copied from CRM [id number]. Topic: Clinical - Request for Lab/Test Order >> Feb 13, 2024 11:26 AM [name] wrote: Reason for CRM: Patient is requesting to have her lab work done before her physical appointment on 07/10.

## 2024-02-14 NOTE — Telephone Encounter (Signed)
 Contacted pt. Left a detail message for pt to schedule a lab appt.

## 2024-02-14 NOTE — Telephone Encounter (Signed)
 Spoke to pt. Pt states she spoke to someone and she would like to come in early for lab but there is no availability. So she will come in at appt at the scheduled time and date.

## 2024-02-14 NOTE — Telephone Encounter (Signed)
 Future lab orders have been placed .  Can get ; lab done pre visit

## 2024-02-14 NOTE — Addendum Note (Signed)
 Addended byBETHA CHARLETT APOLINAR MARLA on: 02/14/2024 08:42 AM   Modules accepted: Orders

## 2024-02-14 NOTE — Telephone Encounter (Signed)
 She could get labs at  [facility] if possible and change orders

## 2024-02-15 NOTE — Addendum Note (Signed)
 Addended byBETHA CHARLETT APOLINAR MARLA on: 02/15/2024 04:58 PM   Modules accepted: Orders

## 2024-02-15 NOTE — Progress Notes (Signed)
 Chief Complaint  Patient presents with   Annual Exam    Pt reports she is fasting. Needs a refill on BP med.     HPI: Patient  [name]  48 y.o. comes in today for Preventive Health Care visit  No major change in health  HT bp med low dose  tolerating needs refill   Health Maintenance  Topic Date Due   COVID-19 Vaccine (3 - 2024-25 season) 03/03/2024 (Originally 04/10/2023)   Cervical Cancer Screening (HPV/Pap Cotest)  05/18/2024 (Originally 03/13/2023)   Hepatitis B Vaccines (1 of 3 - 19+ 3-dose series) 02/15/2025 (Originally 10/17/1994)   INFLUENZA VACCINE  03/09/2024   DTaP/Tdap/Td (2 - Td or Tdap) 11/19/2024   Colonoscopy  10/10/2028   Hepatitis C Screening  Completed   HIV Screening  Completed   HPV VACCINES  Aged Out   Meningococcal B Vaccine  Aged Out   Health Maintenance Review LIFESTYLE:  Exercise:   5-7 days per week  no physical limitations Tobacco/ETS: n Alcohol:   n Sugar beverages:no Sleep: 7-8 hours  Drug use: no HH of  4  pet dog  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 GI /GU: No adominal pain, vomiting, change in bowel habits. No blood in the stool. No significant GU symptoms. SKIN/HEME: ,no acute skin rashes suspicious lesions or bleeding. No lymphadenopathy, nodules, masses.  NEURO/ PSYCH:  No neurologic signs such as weakness numbness. No depression anxiety. IMM/ Allergy: No unusual infections.  Allergy .   REST of 12 system review negative except as per HPI   Past Medical History:  Diagnosis Date   Borderline hypertension    Endometriosis    surgery 2009   Frequency of urination    Hx of varicella    Left ureteral stone    Migraine    PONV (postoperative nausea and vomiting)    Urgency of urination     Past Surgical  History:  Procedure Laterality Date   BREAST BIOPSY Right 05/26/2023   US  RT BREAST BX W LOC DEV 1ST LESION IMG BX SPEC US  GUIDE 05/26/2023 [facility]   COLONOSCOPY  10/11/2018   CYSTOSCOPY/RETROGRADE/URETEROSCOPY/STONE EXTRACTION WITH BASKET Left 02/13/2016   Procedure: CYSTOSCOPY/RETROGRADE/URETEROSCOPY/STONE EXTRACTION WITH BASKET;  Surgeon: Garnette Shack, MD;  Location: [facility];  Service: Urology;  Laterality: Left;   LAPAROSCOPY LYSIS ADHESIONS/  ABLATION AND BX'S OF ENDOMETRIAL LESIONS/  RIGHT FIMBROPLASTY  10-02-2007   NASAL SEPTUM SURGERY  02/ 2016   TONSILLECTOMY  1985    Family History  Problem Relation Age of Onset   Hypertension Mother    Breast cancer Mother        age 66    Heart failure Mother        after breast cancer rx has icdalso    Hypertension Father    Healthy Son    Healthy Son     Social History   Socioeconomic History   Marital status: Married    Spouse name: Not on file   Number of children: Not on file   Years of education: Not on file   Highest education level: Not on file  Occupational History   Not on file  Tobacco Use   Smoking status: Never   Smokeless tobacco: Never  Vaping Use  Vaping status: Never Used  Substance and Sexual Activity   Alcohol use: Yes    Alcohol/week: 0.0 standard drinks of alcohol    Comment: social   Drug use: No   Sexual activity: Yes    Partners: Male    Birth control/protection: I.U.D.    Comment: Mirena  IUD placed 2016 approx  Other Topics Concern   Not on file  Social History Narrative   6-8 hours of sleep per night   Does not work outside home   Married for 12 years with 2 children   Children are 32yrs and 5 yrs.   No pets   etoh social    Hot yoga  And walking    g2 p2   Born raised  [place]   College degree univ [employer]    Social Drivers of Health   Financial Resource Strain: Not on file  Food Insecurity: Not on file   Transportation Needs: Not on file  Physical Activity: Sufficiently Active (02/16/2024)   Exercise Vital Sign    Days of Exercise per Week: 6 days    Minutes of Exercise per Session: 40 min  Stress: No Stress Concern Present (02/16/2024)   [employer] of Occupational Health - Occupational Stress Questionnaire    Feeling of Stress: Not at all  Social Connections: Moderately Isolated (02/16/2024)   Social Connection and Isolation Panel    Frequency of Communication with Friends and Family: More than three times a week    Frequency of Social Gatherings with Friends and Family: Once a week    Attends Religious Services: Patient declined    Database administrator or Organizations: No    Attends Engineer, structural: Patient declined    Marital Status: Married    Outpatient Medications Prior to Visit  Medication Sig Dispense Refill   Magnesium 400 MG CAPS Take 1 tablet by mouth daily.     metroNIDAZOLE (METROCREAM) 0.75 % cream as needed.     Multiple Vitamin (MULTIVITAMIN PO) Take by mouth.     Probiotic Product (PROBIOTIC DAILY) CAPS Take 1 capsule by mouth daily.     tretinoin (RETIN-A) 0.05 % cream tretinoin 0.05 % topical cream  APPLY TO AFFECTED AREA AS NEEDED     TURMERIC PO Take by mouth.     amLODipine  (NORVASC ) 2.5 MG tablet TAKE 1 TABLET BY MOUTH EVERY DAY 90 tablet 1   No facility-administered medications prior to visit.     EXAM:  BP 136/88 (BP Location: Right Arm, Patient Position: Sitting, Cuff Size: Normal)   Pulse 98   Temp 98.4 F (36.9 C) (Oral)   Ht 5' 3 (1.6 m)   Wt 140 lb (63.5 kg)   LMP 01/08/2024 (Exact Date)   SpO2 99%   BMI 24.80 kg/m   Body mass index is 24.8 kg/m. Wt Readings from Last 3 Encounters:  02/16/24 140 lb (63.5 kg)  02/15/23 136 lb 2 oz (61.7 kg)  02/02/22 140 lb (63.5 kg)    Physical Exam: Vital signs reviewed HZW:Uypd is a well-developed well-nourished alert cooperative    who appearsr stated age in no acute  distress.  HEENT: normocephalic atraumatic , Eyes: PERRL EOM's full, conjunctiva clear, Nares: paten,t no deformity discharge or tenderness., Ears: no deformity EAC's clear TMs with normal landmarks. Mouth: clear OP, no lesions, edema.  Moist mucous membranes. Dentition in adequate repair. NECK: supple without masses, thyromegaly or bruits. CHEST/PULM:  Clear to auscultation and percussion breath sounds equal no  wheeze , rales or rhonchi. No chest wall deformities or tenderness. Breast: normal by inspection . No dimpling, discharge, masses, tenderness or discharge . CV: PMI is nondisplaced, S1 S2 no gallops, murmurs, rubs. Peripheral pulses are full without delay.No JVD .  ABDOMEN: Bowel sounds normal nontender  No guard or rebound, no hepato splenomegal no CVA tenderness.   Extremtities:  No clubbing cyanosis or edema, no acute joint swelling or redness no focal atrophy NEURO:  Oriented x3, cranial nerves 3-12 appear to be intact, no obvious focal weakness,gait within normal limits no abnormal reflexes or asymmetrical SKIN: No acute rashes normal turgor, color, no bruising or petechiae. PSYCH: Oriented, good eye contact, no obvious depression anxiety, cognition and judgment appear normal. LN: no cervical axillary adenopathy  Lab Results  Component Value Date   WBC 6.8 02/16/2024   HGB 13.8 02/16/2024   HCT 41.1 02/16/2024   PLT 206.0 02/16/2024   GLUCOSE 80 02/16/2024   CHOL 204 (H) 02/16/2024   TRIG 65.0 02/16/2024   HDL 62.10 02/16/2024   LDLCALC 129 (H) 02/16/2024   ALT 11 02/16/2024   AST 15 02/16/2024   NA 135 02/16/2024   K 3.5 02/16/2024   CL 102 02/16/2024   CREATININE 0.98 02/16/2024   BUN 14 02/16/2024   CO2 25 02/16/2024   TSH 2.66 02/16/2024   HGBA1C 5.2 02/16/2024   MICROALBUR <0.7 02/16/2024    BP Readings from Last 3 Encounters:  02/16/24 136/88  02/15/23 118/70  02/02/22 (!) 160/100    Lab plan reviewed   ASSESSMENT AND PLAN:  Discussed the following  assessment and plan:    ICD-10-CM   1. Visit for preventive health examination  Z00.00 Microalbumin / creatinine urine ratio    TSH    Lipid panel    Hepatic function panel    Hemoglobin A1c    CBC with Differential/Platelet    Basic metabolic panel with GFR    2. Medication management  Z79.899 Microalbumin / creatinine urine ratio    TSH    Lipid panel    Hepatic function panel    Hemoglobin A1c    CBC with Differential/Platelet    Basic metabolic panel with GFR    3. Elevated blood pressure reading  R03.0 Microalbumin / creatinine urine ratio    TSH    Lipid panel    Hepatic function panel    Hemoglobin A1c    CBC with Differential/Platelet    Basic metabolic panel with GFR    4. Family history of heart disease  Z82.49 Microalbumin / creatinine urine ratio    TSH    Lipid panel    Hepatic function panel    Hemoglobin A1c    CBC with Differential/Platelet    Basic metabolic panel with GFR    5. Screening, lipid  Z13.220 Microalbumin / creatinine urine ratio    TSH    Lipid panel    Hepatic function panel    Hemoglobin A1c    CBC with Differential/Platelet    Basic metabolic panel with GFR     Continue lifestyle intervention healthy eating and exercise . Optimize  BP  control  She reports better readings when not in office and is aware Return in about 1 year (around 02/15/2025) for depending on results.  Patient Care Team: Michaiah Maiden, Apolinar POUR, MD as PCP - General (Internal Medicine) Anderson, Dena D, PA-C (Physician Assistant) Sarrah Browning, MD as Consulting Physician (Obstetrics and Gynecology) Patient Instructions  Good to see you today  Exam is normal  Monitor bp readings to ensure at goal best below 130/80 average . Continue lifestyle intervention healthy eating and exercise .  Lab today .  Landrey Mahurin K. Danira Nylander M.D.

## 2024-02-16 ENCOUNTER — Encounter: Payer: Self-pay | Admitting: Internal Medicine

## 2024-02-16 ENCOUNTER — Ambulatory Visit: Payer: BC Managed Care – PPO | Admitting: Internal Medicine

## 2024-02-16 VITALS — BP 136/88 | HR 98 | Temp 98.4°F | Ht 63.0 in | Wt 140.0 lb

## 2024-02-16 DIAGNOSIS — Z79899 Other long term (current) drug therapy: Secondary | ICD-10-CM

## 2024-02-16 DIAGNOSIS — Z0000 Encounter for general adult medical examination without abnormal findings: Secondary | ICD-10-CM

## 2024-02-16 DIAGNOSIS — I10 Essential (primary) hypertension: Secondary | ICD-10-CM

## 2024-02-16 DIAGNOSIS — Z8249 Family history of ischemic heart disease and other diseases of the circulatory system: Secondary | ICD-10-CM

## 2024-02-16 DIAGNOSIS — R030 Elevated blood-pressure reading, without diagnosis of hypertension: Secondary | ICD-10-CM

## 2024-02-16 DIAGNOSIS — Z13220 Encounter for screening for lipoid disorders: Secondary | ICD-10-CM

## 2024-02-16 LAB — HEPATIC FUNCTION PANEL
ALT: 11 U/L (ref 0–35)
AST: 15 U/L (ref 0–37)
Albumin: 4.6 g/dL (ref 3.5–5.2)
Alkaline Phosphatase: 56 U/L (ref 39–117)
Bilirubin, Direct: 0.1 mg/dL (ref 0.0–0.3)
Total Bilirubin: 0.6 mg/dL (ref 0.2–1.2)
Total Protein: 7.4 g/dL (ref 6.0–8.3)

## 2024-02-16 LAB — HEMOGLOBIN A1C: Hgb A1c MFr Bld: 5.2 % (ref 4.6–6.5)

## 2024-02-16 LAB — MICROALBUMIN / CREATININE URINE RATIO
Creatinine,U: 41.6 mg/dL
Microalb Creat Ratio: UNDETERMINED mg/g (ref 0.0–30.0)
Microalb, Ur: <0.7 mg/dL

## 2024-02-16 LAB — CBC WITH DIFFERENTIAL/PLATELET
Basophils Absolute: 0 K/uL (ref 0.0–0.1)
Basophils Relative: 0.3 % (ref 0.0–3.0)
Eosinophils Absolute: 0.3 K/uL (ref 0.0–0.7)
Eosinophils Relative: 4.1 % (ref 0.0–5.0)
HCT: 41.1 % (ref 36.0–46.0)
Hemoglobin: 13.8 g/dL (ref 12.0–15.0)
Lymphocytes Relative: 17.2 % (ref 12.0–46.0)
Lymphs Abs: 1.2 K/uL (ref 0.7–4.0)
MCHC: 33.7 g/dL (ref 30.0–36.0)
MCV: 90.1 fl (ref 78.0–100.0)
Monocytes Absolute: 0.5 K/uL (ref 0.1–1.0)
Monocytes Relative: 7.1 % (ref 3.0–12.0)
Neutro Abs: 4.8 K/uL (ref 1.4–7.7)
Neutrophils Relative %: 71.3 % (ref 43.0–77.0)
Platelets: 206 K/uL (ref 150.0–400.0)
RBC: 4.56 Mil/uL (ref 3.87–5.11)
RDW: 13.2 % (ref 11.5–15.5)
WBC: 6.8 K/uL (ref 4.0–10.5)

## 2024-02-16 LAB — BASIC METABOLIC PANEL WITH GFR
BUN: 14 mg/dL (ref 6–23)
CO2: 25 meq/L (ref 19–32)
Calcium: 9.6 mg/dL (ref 8.4–10.5)
Chloride: 102 meq/L (ref 96–112)
Creatinine, Ser: 0.98 mg/dL (ref 0.40–1.20)
GFR: 68.34 mL/min (ref >60.00)
Glucose, Bld: 80 mg/dL (ref 70–99)
Potassium: 3.5 meq/L (ref 3.5–5.1)
Sodium: 135 meq/L (ref 135–145)

## 2024-02-16 LAB — LIPID PANEL
Cholesterol: 204 mg/dL — ABNORMAL HIGH (ref 0–200)
HDL: 62.1 mg/dL (ref >39.00)
LDL Cholesterol: 129 mg/dL — ABNORMAL HIGH (ref 0–99)
NonHDL: 141.56
Total CHOL/HDL Ratio: 3
Triglycerides: 65 mg/dL (ref 0.0–149.0)
VLDL: 13 mg/dL (ref 0.0–40.0)

## 2024-02-16 LAB — TSH: TSH: 2.66 u[IU]/mL (ref 0.35–5.50)

## 2024-02-16 MED ORDER — AMLODIPINE BESYLATE 2.5 MG PO TABS: 2.5000 mg | ORAL_TABLET | Freq: Every day | ORAL | 3 refills | Status: AC

## 2024-02-16 NOTE — Patient Instructions (Addendum)
 Good to see you today  Exam is normal  Monitor bp readings to ensure at goal best below 130/80 average . Continue lifestyle intervention healthy eating and exercise .  Lab today .

## 2024-02-18 ENCOUNTER — Ambulatory Visit: Payer: Self-pay | Admitting: Internal Medicine

## 2024-02-18 NOTE — Progress Notes (Signed)
 Cholesterol up slighlty but favorable ratio  rest of lab normal ranges . Continue lifestyle intervention healthy eating and exercise . And bp control  The 10-year ASCVD risk score (Arnett DK, et al., 2019) is: 1.4%   Values used to calculate the score:     Age: 48 years     Clincally relevant sex: Female     Is Non-Hispanic African American: No     Diabetic: No     Tobacco smoker: No     Systolic Blood Pressure: 136 mmHg     Is BP treated: Yes     HDL Cholesterol: 62.1 mg/dL     Total Cholesterol: 204 mg/dL

## 2024-06-05 DIAGNOSIS — Z01419 Encounter for gynecological examination (general) (routine) without abnormal findings: Secondary | ICD-10-CM | POA: Diagnosis not present

## 2024-07-04 DIAGNOSIS — D485 Neoplasm of uncertain behavior of skin: Secondary | ICD-10-CM | POA: Diagnosis not present

## 2024-07-04 DIAGNOSIS — L738 Other specified follicular disorders: Secondary | ICD-10-CM | POA: Diagnosis not present

## 2025-02-19 ENCOUNTER — Encounter: Admitting: Internal Medicine
